# Patient Record
Sex: Male | Born: 1985 | Race: Black or African American | Hispanic: No | Marital: Single | Smoking: Current every day smoker
Health system: Southern US, Community
[De-identification: ages and names within clinical notes are randomized; demographics above are authoritative.]

## PROBLEM LIST (undated history)

## (undated) DIAGNOSIS — A749 Chlamydial infection, unspecified: Secondary | ICD-10-CM

## (undated) DIAGNOSIS — J40 Bronchitis, not specified as acute or chronic: Secondary | ICD-10-CM

---
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
## 2006-07-19 ENCOUNTER — Emergency Department (HOSPITAL_COMMUNITY): Admission: EM | Admit: 2006-07-19 | Discharge: 2006-07-19 | Payer: Self-pay | Admitting: Emergency Medicine

## 2006-11-25 ENCOUNTER — Emergency Department (HOSPITAL_COMMUNITY): Admission: EM | Admit: 2006-11-25 | Discharge: 2006-11-25 | Payer: Self-pay | Admitting: Emergency Medicine

## 2007-10-13 ENCOUNTER — Emergency Department (HOSPITAL_COMMUNITY): Admission: EM | Admit: 2007-10-13 | Discharge: 2007-10-13 | Payer: Self-pay | Admitting: Emergency Medicine

## 2008-04-11 ENCOUNTER — Emergency Department (HOSPITAL_COMMUNITY): Admission: EM | Admit: 2008-04-11 | Discharge: 2008-04-11 | Payer: Self-pay | Admitting: Emergency Medicine

## 2008-05-29 DIAGNOSIS — A749 Chlamydial infection, unspecified: Secondary | ICD-10-CM

## 2008-05-29 HISTORY — DX: Chlamydial infection, unspecified: A74.9

## 2008-08-24 ENCOUNTER — Emergency Department (HOSPITAL_COMMUNITY): Admission: EM | Admit: 2008-08-24 | Discharge: 2008-08-25 | Payer: Self-pay | Admitting: Emergency Medicine

## 2008-08-25 ENCOUNTER — Emergency Department (HOSPITAL_COMMUNITY): Admission: EM | Admit: 2008-08-25 | Discharge: 2008-08-25 | Payer: Self-pay | Admitting: Emergency Medicine

## 2008-09-08 ENCOUNTER — Emergency Department (HOSPITAL_COMMUNITY): Admission: EM | Admit: 2008-09-08 | Discharge: 2008-09-08 | Payer: Self-pay | Admitting: Emergency Medicine

## 2010-09-08 LAB — URINE CULTURE
Colony Count: NO GROWTH
Culture: NO GROWTH

## 2010-09-08 LAB — GC/CHLAMYDIA PROBE AMP, GENITAL
Chlamydia, DNA Probe: POSITIVE — AB
GC Probe Amp, Genital: NEGATIVE

## 2010-11-24 ENCOUNTER — Emergency Department (HOSPITAL_COMMUNITY)
Admission: EM | Admit: 2010-11-24 | Discharge: 2010-11-24 | Disposition: A | Discharge status: Home / Self Care | Payer: Self-pay | Attending: Emergency Medicine | Admitting: Emergency Medicine

## 2010-11-24 DIAGNOSIS — T675XXA Heat exhaustion, unspecified, initial encounter: Secondary | ICD-10-CM | POA: Insufficient documentation

## 2010-11-24 DIAGNOSIS — X30XXXA Exposure to excessive natural heat, initial encounter: Secondary | ICD-10-CM | POA: Insufficient documentation

## 2010-11-24 DIAGNOSIS — Y9269 Other specified industrial and construction area as the place of occurrence of the external cause: Secondary | ICD-10-CM | POA: Insufficient documentation

## 2011-02-22 LAB — CBC
MCHC: 34.1
MCV: 89.7
Platelets: 228
RBC: 4.97

## 2011-02-22 LAB — DIFFERENTIAL
Eosinophils Relative: 0
Lymphocytes Relative: 14
Lymphs Abs: 3.1
Neutro Abs: 18 — ABNORMAL HIGH

## 2011-02-22 LAB — RAPID URINE DRUG SCREEN, HOSP PERFORMED
Amphetamines: NOT DETECTED
Barbiturates: NOT DETECTED
Benzodiazepines: NOT DETECTED
Cocaine: NOT DETECTED
Opiates: NOT DETECTED

## 2011-02-22 LAB — COMPREHENSIVE METABOLIC PANEL
AST: 352 — ABNORMAL HIGH
CO2: 25
Calcium: 9.6
Creatinine, Ser: 1.16
GFR calc Af Amer: >60
GFR calc non Af Amer: >60

## 2011-02-28 LAB — GC/CHLAMYDIA PROBE AMP, GENITAL: GC Probe Amp, Genital: NEGATIVE

## 2011-03-15 LAB — GC/CHLAMYDIA PROBE AMP, GENITAL
Chlamydia, DNA Probe: POSITIVE — AB
GC Probe Amp, Genital: NEGATIVE

## 2011-05-23 ENCOUNTER — Emergency Department (HOSPITAL_COMMUNITY)
Admission: EM | Admit: 2011-05-23 | Discharge: 2011-05-23 | Disposition: A | Discharge status: Home / Self Care | Payer: Self-pay | Attending: Emergency Medicine | Admitting: Emergency Medicine

## 2011-05-23 ENCOUNTER — Encounter: Payer: Self-pay | Admitting: *Deleted

## 2011-05-23 DIAGNOSIS — R21 Rash and other nonspecific skin eruption: Secondary | ICD-10-CM | POA: Insufficient documentation

## 2011-05-23 DIAGNOSIS — L298 Other pruritus: Secondary | ICD-10-CM | POA: Insufficient documentation

## 2011-05-23 DIAGNOSIS — L2989 Other pruritus: Secondary | ICD-10-CM | POA: Insufficient documentation

## 2011-05-23 MED ORDER — DIPHENHYDRAMINE HCL 12.5 MG/5ML PO ELIX
25.0000 mg | ORAL_SOLUTION | Freq: Once | ORAL | Status: AC
  Administered 2011-05-23: 25 mg via ORAL
  Filled 2011-05-23: qty 10

## 2011-05-23 MED ORDER — FAMOTIDINE 40 MG/5ML PO SUSR
40.0000 mg | Freq: Once | ORAL | Status: AC
  Administered 2011-05-23: 40 mg via ORAL
  Filled 2011-05-23: qty 5

## 2011-05-23 MED ORDER — TRIAMCINOLONE ACETONIDE 0.1 % EX CREA: TOPICAL_CREAM | Freq: Two times a day (BID) | CUTANEOUS | Status: DC

## 2011-05-23 NOTE — ED Notes (Signed)
Pt reports red rash and itching to bilateral arms, hands and chest.

## 2011-05-23 NOTE — ED Provider Notes (Signed)
History     CSN: [id number]  Arrival date & time 05/23/11  1191   First MD Initiated Contact with Patient 05/23/11 [phone]      Chief Complaint  Patient presents with  . Rash    (Consider location/radiation/quality/duration/timing/severity/associated sxs/prior treatment) HPI Comments: Patient has c/o having a rash for the last 3 days.  It is pruritic in nature and is located bilaterally in both arms hands and his chest.  He states he's never had a reaction like this before and does not have any contacts with similar reaction.  He denies the use of any new products or medications.  Patient denies body aches, neck stiffness, fever, night sweats, chills, N/V, abd pn, SOB, lip swelling, or feeling like his throat is closing. Denies being bitten by anything.   Patient is a 25 y.o. male presenting with rash. The history is provided by the patient.  Rash  This is a new problem. The current episode started more than 2 days ago. The problem has not changed since onset.The problem is associated with an unknown factor. There has been no fever. The rash is present on the left hand, left arm, right arm, right hand and torso. The pain is at a severity of 0/10. The patient is experiencing no pain. Associated symptoms include itching. Pertinent negatives include no blisters, no pain and no weeping. He has tried antihistamines for the symptoms. The treatment provided moderate relief. Risk factors: no new medications, products or enviormental exposures.    History reviewed. No pertinent past medical history.  History reviewed. No pertinent past surgical history.  History reviewed. No pertinent family history.  History  Substance Use Topics  . Smoking status: Current Everyday Smoker -- 1.0 packs/day  . Smokeless tobacco: Not on file  . Alcohol Use: No      Review of Systems  Constitutional: Negative for fever, chills and appetite change.  HENT: Negative for congestion.   Eyes: Negative for visual  disturbance.  Respiratory: Negative for shortness of breath.   Cardiovascular: Negative for chest pain and leg swelling.  Gastrointestinal: Negative for abdominal pain.  Genitourinary: Negative for dysuria, urgency and frequency.  Skin: Positive for itching and rash.  Neurological: Negative for dizziness, syncope, weakness, light-headedness, numbness and headaches.  Psychiatric/Behavioral: Negative for confusion.  All other systems reviewed and are negative.    Allergies  Review of patient's allergies indicates no known allergies.  Home Medications   Current Outpatient Rx  Name Route Sig Dispense Refill  . TRIAMCINOLONE ACETONIDE 0.1 % EX CREA Topical Apply topically 2 (two) times daily. 30 g 0    BP 121/86  Pulse 86  Temp(Src) 97.4 F (36.3 C) (Oral)  Resp 19  SpO2 99%  Physical Exam  Nursing note and vitals reviewed. Constitutional: He is oriented to person, place, and time. He appears well-developed and well-nourished. No distress.  HENT:  Head: Normocephalic and atraumatic.  Eyes: Conjunctivae and EOM are normal.  Neck: Normal range of motion. Neck supple.  Pulmonary/Chest: Effort normal and breath sounds normal. No stridor. No respiratory distress. He has no wheezes. He has no rales. He exhibits no tenderness.       No stridor  Musculoskeletal: Normal range of motion.  Neurological: He is alert and oriented to person, place, and time.  Skin: Skin is warm and dry. Rash noted. No petechiae and no purpura noted. Rash is maculopapular (located on forearms and anterior chest. No induration or central clearing. ) and urticarial. Rash is not nodular,  not pustular and not vesicular. He is not diaphoretic.  Psychiatric: He has a normal mood and affect. His behavior is normal.    ED Course  Procedures (including critical care time)  Labs Reviewed - No data to display No results found.   1. Rash    Pt dc with Kenalog and Benadryl and referral to dermatology for follow  up.     MDM  Rash         Jaci Carrel, [place] 05/23/11 1135

## 2011-05-26 NOTE — ED Provider Notes (Signed)
Medical screening examination/treatment/procedure(s) were performed by non-physician practitioner and as supervising physician I was immediately available for consultation/collaboration.   Zandyr Barnhill R Luma Clopper, MD 05/26/11 0839 

## 2011-07-04 ENCOUNTER — Emergency Department (HOSPITAL_COMMUNITY)
Admission: EM | Admit: 2011-07-04 | Discharge: 2011-07-04 | Disposition: A | Discharge status: Home / Self Care | Payer: Self-pay | Attending: Emergency Medicine | Admitting: Emergency Medicine

## 2011-07-04 ENCOUNTER — Encounter (HOSPITAL_COMMUNITY): Payer: Self-pay | Admitting: *Deleted

## 2011-07-04 DIAGNOSIS — F17200 Nicotine dependence, unspecified, uncomplicated: Secondary | ICD-10-CM | POA: Insufficient documentation

## 2011-07-04 DIAGNOSIS — R300 Dysuria: Secondary | ICD-10-CM | POA: Insufficient documentation

## 2011-07-04 DIAGNOSIS — N342 Other urethritis: Secondary | ICD-10-CM | POA: Insufficient documentation

## 2011-07-04 DIAGNOSIS — R369 Urethral discharge, unspecified: Secondary | ICD-10-CM | POA: Insufficient documentation

## 2011-07-04 HISTORY — DX: Chlamydial infection, unspecified: A74.9

## 2011-07-04 LAB — URINALYSIS, ROUTINE W REFLEX MICROSCOPIC
Glucose, UA: NEGATIVE mg/dL
Nitrite: NEGATIVE
Specific Gravity, Urine: 1.036 — ABNORMAL HIGH (ref 1.005–1.030)
pH: 6.5 (ref 5.0–8.0)

## 2011-07-04 LAB — URINE MICROSCOPIC-ADD ON

## 2011-07-04 MED ORDER — CEFTRIAXONE SODIUM 250 MG IJ SOLR
250.0000 mg | Freq: Once | INTRAMUSCULAR | Status: AC
  Administered 2011-07-04: 250 mg via INTRAMUSCULAR
  Filled 2011-07-04: qty 250

## 2011-07-04 MED ORDER — ONDANSETRON 4 MG PO TBDP
ORAL_TABLET | ORAL | Status: AC
  Administered 2011-07-04: 4 mg
  Filled 2011-07-04: qty 1

## 2011-07-04 MED ORDER — AZITHROMYCIN 250 MG PO TABS
1000.0000 mg | ORAL_TABLET | Freq: Once | ORAL | Status: AC
  Administered 2011-07-04: 1000 mg via ORAL
  Filled 2011-07-04: qty 4

## 2011-07-04 MED ORDER — LIDOCAINE HCL (PF) 1 % IJ SOLN
INTRAMUSCULAR | Status: AC
  Administered 2011-07-04: 1 mL
  Filled 2011-07-04: qty 5

## 2011-07-04 NOTE — ED Provider Notes (Signed)
History    CSN: [id number]  Arrival date & time 07/04/11  0654   First MD Initiated Contact with Patient 07/04/11 [phone]      Chief Complaint  Patient presents with  . Penile Discharge   HPI: 26 yo man with PMH significant for a treated chlamydia infection in 2009 & 2010 presents with penile discharge (yellow, noticed with urination) x 1 day accompanied with burning on urination. He last had unprotected sex on Friday, and reports 2 sexual partners.  Has been tested for HIV in the past, negative. Denies any pruritis or scrotal involvement.  Denies pain with erection.  Denies pain with BM.  Past Medical History  Diagnosis Date  . Chlamydia 2010    treated   History reviewed. No pertinent past surgical history.  History reviewed. No pertinent family history.  History  Substance Use Topics  . Smoking status: Current Everyday Smoker -- 1.0 packs/day for 10 years    Types: Cigarettes  . Smokeless tobacco: Not on file  . Alcohol Use: 0.0 oz/week    3-4 drink(s) per week     occ     Review of Systems  Constitutional: Negative for fever, chills, diaphoresis, activity change, appetite change, fatigue and unexpected weight change.  HENT: Negative for congestion, sore throat, rhinorrhea, mouth sores and trouble swallowing.   Eyes: Negative for pain, discharge, redness, itching and visual disturbance.  Respiratory: Negative for cough, shortness of breath and wheezing.   Cardiovascular: Negative for chest pain and palpitations.  Gastrointestinal: Negative for vomiting, abdominal pain and abdominal distention.  Genitourinary: Positive for dysuria, discharge and penile pain. Negative for hematuria, flank pain, penile swelling, scrotal swelling, genital sores and testicular pain.  Musculoskeletal: Negative for myalgias, back pain, joint swelling, arthralgias and gait problem.  Neurological: Negative for dizziness, syncope, weakness, light-headedness, numbness and headaches.    Psychiatric/Behavioral: Negative for hallucinations, behavioral problems, confusion and agitation. The patient is not nervous/anxious.    Allergies  Review of patient's allergies indicates no known allergies.  Home Medications  No current outpatient prescriptions on file.  BP 139/83  Pulse 106  Temp(Src) 98.2 F (36.8 C) (Oral)  Resp 19  SpO2 97%  Physical Exam  Constitutional: He is oriented to person, place, and time. He appears well-developed and well-nourished.  Eyes: Conjunctivae and EOM are normal. Pupils are equal, round, and reactive to light.  Neck: Normal range of motion. Neck supple.  Cardiovascular: Regular rhythm, normal heart sounds and intact distal pulses.  Exam reveals no gallop and no friction rub.   No murmur heard. Pulmonary/Chest: Effort normal and breath sounds normal. No respiratory distress. He has no wheezes. He has no rales. He exhibits no tenderness.  Abdominal: Soft. Bowel sounds are normal. He exhibits no distension and no mass. There is no tenderness. There is no rebound and no guarding.  Genitourinary: No penile tenderness.       Unable to express discharge   Musculoskeletal: Normal range of motion.  Neurological: He is alert and oriented to person, place, and time.  Skin: Skin is warm and dry.  Psychiatric: He has a normal mood and affect. His behavior is normal.    ED Course  Procedures (including critical care time)  Labs Reviewed  URINALYSIS, ROUTINE W REFLEX MICROSCOPIC - Abnormal; Notable for the following:    APPearance CLOUDY (*)    Specific Gravity, Urine 1.036 (*)    Bilirubin Urine SMALL (*)    Ketones, ur 15 (*)    Leukocytes, UA  LARGE (*)    All other components within normal limits  URINE MICROSCOPIC-ADD ON - Abnormal; Notable for the following:    Bacteria, UA FEW (*)    All other components within normal limits  GC/CHLAMYDIA PROBE AMP, URINE   No results found.   No diagnosis found.  MDM  25 yo M with PMH of  chlamydia urethritis presents with signs and symptoms of urethritis, likely 2/2 chlamydia.  Will treat with azithromycin 1g PO x 1 dose and ceftriaxone 250mg  IM x 1.  Patient counseled to inform his sexual partners and to abstain from sexual activity and be sure to use condoms in the future.  He should also follow up outpatient for HIV testing.          Vernice Jefferson, MD 07/04/11 [phone]

## 2011-07-04 NOTE — ED Notes (Signed)
Pt is here with penile discharge that is cream in color since yesterday and has pain with urination

## 2011-07-04 NOTE — ED Provider Notes (Signed)
I saw and evaluated the patient, reviewed the resident's note and I agree with the findings and plan.   Forbes Cellar, MD 07/04/11 [phone]

## 2011-07-04 NOTE — ED Notes (Signed)
Pt medicated. Denies pain at the present. Vital signs stable. No signs of distress. Pt to be discharged home.

## 2011-07-04 NOTE — ED Notes (Signed)
Pt presents to department for evaluation of penile discharge. Onset yesterday. Pt states yellow colored penile discharge with painful urination. States unprotected sex with x2 partners recently. Denies hematuria. 5/10 pain at the time. Denies abdominal pain. He is alert and oriented x4. No signs of distress noted.

## 2011-08-29 ENCOUNTER — Encounter (HOSPITAL_COMMUNITY): Payer: Self-pay

## 2011-08-29 ENCOUNTER — Emergency Department (HOSPITAL_COMMUNITY)
Admission: EM | Admit: 2011-08-29 | Discharge: 2011-08-30 | Disposition: A | Discharge status: Home / Self Care | Payer: Self-pay | Attending: Emergency Medicine | Admitting: Emergency Medicine

## 2011-08-29 DIAGNOSIS — R369 Urethral discharge, unspecified: Secondary | ICD-10-CM | POA: Insufficient documentation

## 2011-08-29 DIAGNOSIS — R599 Enlarged lymph nodes, unspecified: Secondary | ICD-10-CM | POA: Insufficient documentation

## 2011-08-29 MED ORDER — AZITHROMYCIN 250 MG PO TABS
1000.0000 mg | ORAL_TABLET | Freq: Once | ORAL | Status: AC
  Administered 2011-08-30: 1000 mg via ORAL
  Filled 2011-08-29: qty 4

## 2011-08-29 MED ORDER — CEFTRIAXONE SODIUM 250 MG IJ SOLR
250.0000 mg | Freq: Once | INTRAMUSCULAR | Status: AC
  Administered 2011-08-30: 250 mg via INTRAMUSCULAR
  Filled 2011-08-29: qty 250

## 2011-08-29 NOTE — ED Notes (Signed)
Clear penile discharge x 2 days.  Burning w/urination.

## 2011-08-30 MED ORDER — LIDOCAINE HCL 2 % IJ SOLN
INTRAMUSCULAR | Status: AC
  Administered 2011-08-30: 20 mg
  Filled 2011-08-30: qty 1

## 2011-08-30 NOTE — ED Provider Notes (Signed)
History     CSN: [id number]  Arrival date & time 08/29/11  2151   First MD Initiated Contact with Patient 08/29/11 2218      Chief Complaint  Patient presents with  . Penile Discharge    (Consider location/radiation/quality/duration/timing/severity/associated sxs/prior treatment) HPI Comments: Patient reports that he has noticed clear colored penile discharge for the past 2 days.  He is currently sexually active with one partner.  PMH significant for Gonorrhea.  He was seen in the ED two months ago for same complaint.  He was treated for [facility] and chlamydia at that time with Ceftriaxone and Azithromycin, but no GC/Chlamydia testing was done.  Patient is a 26 y.o. male presenting with penile discharge. The history is provided by the patient and medical records.  Penile Discharge This is a new problem. Episode onset: two days ago. Pertinent negatives include no chills, fever, nausea, rash or vomiting. The symptoms are aggravated by nothing. He has tried nothing for the symptoms.    Past Medical History  Diagnosis Date  . Chlamydia 2010    treated    History reviewed. No pertinent past surgical history.  History reviewed. No pertinent family history.  History  Substance Use Topics  . Smoking status: Current Some Day Smoker -- 1.0 packs/day for 10 years    Types: Cigarettes  . Smokeless tobacco: Not on file  . Alcohol Use: 0.0 oz/week    3-4 drink(s) per week     daily      Review of Systems  Constitutional: Negative for fever and chills.  Gastrointestinal: Negative for nausea and vomiting.  Genitourinary: Positive for discharge. Negative for hematuria, penile swelling, scrotal swelling, difficulty urinating, penile pain and testicular pain.  Skin: Negative for rash.    Allergies  Review of patient's allergies indicates no known allergies.  Home Medications  No current outpatient prescriptions on file.  BP 120/83  Pulse 77  Temp(Src) 98.2 F (36.8 C) (Oral)  Resp  16  Ht 5\' 5"  (1.651 m)  Wt 130 lb (58.968 kg)  BMI 21.63 kg/m2  SpO2 100%  Physical Exam  Nursing note and vitals reviewed. Constitutional: He appears well-developed and well-nourished. No distress.  HENT:  Head: Normocephalic and atraumatic.  Cardiovascular: Normal rate, regular rhythm and normal heart sounds.   Pulmonary/Chest: Effort normal and breath sounds normal.  Abdominal: Soft. There is no tenderness.  Genitourinary: Testes normal. Right testis shows no mass, no swelling and no tenderness. Left testis shows no mass, no swelling and no tenderness. Circumcised. No penile erythema. Discharge found.       Small amount of greenish/yellowish penile discharge.  Lymphadenopathy:       Right: Inguinal adenopathy present.       Left: No inguinal adenopathy present.  Neurological: He is alert.  Skin: Skin is warm and dry. He is not diaphoretic. No erythema.    ED Course  Procedures (including critical care time)   Labs Reviewed  GC/CHLAMYDIA PROBE AMP, GENITAL   No results found.   1. Penile discharge       MDM  Due to patient's past history of gonorrhea and inguinal adenopathy and yellow/green discharge visualized on exam, patient was treated with Ceftriaxone and Azithromycin while in the ED.  GC/Chlamydia pending.        Pascal Lux [place], PA-C 08/30/11 0136  Magnus Sinning, PA-C 08/30/11 [phone]

## 2011-08-30 NOTE — ED Provider Notes (Signed)
Medical screening examination/treatment/procedure(s) were performed by non-physician practitioner and as supervising physician I was immediately available for consultation/collaboration.  Wyonia Fontanella M Sylwia Cuervo, MD 08/30/11 0657 

## 2011-11-27 ENCOUNTER — Encounter (HOSPITAL_COMMUNITY): Payer: Self-pay | Admitting: Emergency Medicine

## 2011-11-27 ENCOUNTER — Emergency Department (HOSPITAL_COMMUNITY)
Admission: EM | Admit: 2011-11-27 | Discharge: 2011-11-27 | Disposition: A | Discharge status: Home / Self Care | Payer: Self-pay | Attending: Emergency Medicine | Admitting: Emergency Medicine

## 2011-11-27 DIAGNOSIS — F17200 Nicotine dependence, unspecified, uncomplicated: Secondary | ICD-10-CM | POA: Insufficient documentation

## 2011-11-27 DIAGNOSIS — J029 Acute pharyngitis, unspecified: Secondary | ICD-10-CM | POA: Insufficient documentation

## 2011-11-27 MED ORDER — DEXAMETHASONE SODIUM PHOSPHATE 10 MG/ML IJ SOLN
10.0000 mg | Freq: Once | INTRAMUSCULAR | Status: AC
  Administered 2011-11-27: 10 mg via INTRAMUSCULAR
  Filled 2011-11-27: qty 1

## 2011-11-27 MED ORDER — IBUPROFEN 800 MG PO TABS: 800.0000 mg | ORAL_TABLET | Freq: Three times a day (TID) | ORAL | Status: AC

## 2011-11-27 NOTE — ED Provider Notes (Signed)
History     CSN: [id number]  Arrival date & time 11/27/11  7829   First MD Initiated Contact with Patient 11/27/11 [phone]      Chief Complaint  Patient presents with  . Sore Throat    (Consider location/radiation/quality/duration/timing/severity/associated sxs/prior treatment) HPI Comments: Sore throat R>L onset yesterday with painful swallowing.  No fever, chills, nausea, vomiting, cough, abdominal pain, rash. No treatment at home.   Patient is a 26 y.o. male presenting with pharyngitis. The history is provided by the patient.  Sore Throat This is a new problem. The current episode started yesterday. The problem occurs constantly. The problem has not changed since onset.Pertinent negatives include no chest pain, no abdominal pain, no headaches and no shortness of breath. The symptoms are aggravated by swallowing. Nothing relieves the symptoms. He has tried nothing for the symptoms.    Past Medical History  Diagnosis Date  . Chlamydia 2010    treated    History reviewed. No pertinent past surgical history.  History reviewed. No pertinent family history.  History  Substance Use Topics  . Smoking status: Current Some Day Smoker -- 1.0 packs/day for 10 years    Types: Cigarettes  . Smokeless tobacco: Not on file  . Alcohol Use: 0.0 oz/week    3-4 drink(s) per week     daily      Review of Systems  Constitutional: Negative for fever, activity change and appetite change.  HENT: Positive for sore throat and trouble swallowing. Negative for congestion, rhinorrhea and neck pain.   Respiratory: Negative for cough, chest tightness and shortness of breath.   Cardiovascular: Negative for chest pain.  Gastrointestinal: Negative for vomiting and abdominal pain.  Genitourinary: Negative for dysuria.  Musculoskeletal: Negative for back pain.  Skin: Negative for rash.  Neurological: Negative for dizziness, weakness and headaches.    Allergies  Review of patient's allergies  indicates no known allergies.  Home Medications   Current Outpatient Rx  Name Route Sig Dispense Refill  . IBUPROFEN 800 MG PO TABS Oral Take 1 tablet (800 mg total) by mouth 3 (three) times daily. 21 tablet 0    BP 130/88  Pulse 73  Temp 98.3 F (36.8 C) (Oral)  Resp 16  SpO2 100%  Physical Exam  Constitutional: He is oriented to person, place, and time. He appears well-developed and well-nourished. No distress.  HENT:  Head: Normocephalic and atraumatic.  Mouth/Throat: Oropharynx is clear and moist. No oropharyngeal exudate.       No asymmetry, no tongue elevation, no exudates  Eyes: Conjunctivae and EOM are normal. Pupils are equal, round, and reactive to light.  Neck: Normal range of motion. Neck supple.       No meningismus  Cardiovascular: Normal rate, regular rhythm and normal heart sounds.   No murmur heard. Pulmonary/Chest: Effort normal and breath sounds normal. No respiratory distress.  Abdominal: Soft. There is no tenderness. There is no rebound and no guarding.  Musculoskeletal: Normal range of motion. He exhibits no edema and no tenderness.  Lymphadenopathy:    He has cervical adenopathy.  Neurological: He is alert and oriented to person, place, and time. No cranial nerve deficit.  Skin: Skin is warm.    ED Course  Procedures (including critical care time)   Labs Reviewed  RAPID STREP SCREEN   No results found.   1. Pharyngitis       MDM  Sore throat, painful swallowing, lymphadenopathy.  Controlling secretions, tolerating PO. Nontoxic appearing.  Rapid strep  negative. Patient with one Center criteria. Suspect viral pharyngitis we'll treat with supportive care. Recheck precautions.      Glynn Octave, MD 11/27/11 [phone]

## 2011-11-27 NOTE — Discharge Instructions (Signed)

## 2011-11-27 NOTE — ED Notes (Signed)
Pt reports sore throat onset yesterday. Pt reports painful to swallow. Pt denies fever.

## 2012-01-14 ENCOUNTER — Emergency Department (HOSPITAL_COMMUNITY)
Admission: EM | Admit: 2012-01-14 | Discharge: 2012-01-14 | Disposition: A | Discharge status: Home / Self Care | Payer: Self-pay | Attending: Emergency Medicine | Admitting: Emergency Medicine

## 2012-01-14 ENCOUNTER — Encounter (HOSPITAL_COMMUNITY): Payer: Self-pay | Admitting: *Deleted

## 2012-01-14 DIAGNOSIS — F17200 Nicotine dependence, unspecified, uncomplicated: Secondary | ICD-10-CM | POA: Insufficient documentation

## 2012-01-14 DIAGNOSIS — K089 Disorder of teeth and supporting structures, unspecified: Secondary | ICD-10-CM | POA: Insufficient documentation

## 2012-01-14 DIAGNOSIS — R221 Localized swelling, mass and lump, neck: Secondary | ICD-10-CM | POA: Insufficient documentation

## 2012-01-14 DIAGNOSIS — K0889 Other specified disorders of teeth and supporting structures: Secondary | ICD-10-CM

## 2012-01-14 DIAGNOSIS — R220 Localized swelling, mass and lump, head: Secondary | ICD-10-CM | POA: Insufficient documentation

## 2012-01-14 MED ORDER — PENICILLIN V POTASSIUM 500 MG PO TABS: 500.0000 mg | ORAL_TABLET | Freq: Four times a day (QID) | ORAL | Status: AC

## 2012-01-14 MED ORDER — TRAMADOL HCL 50 MG PO TABS: 50.0000 mg | ORAL_TABLET | Freq: Four times a day (QID) | ORAL | Status: AC | PRN

## 2012-01-14 NOTE — ED Provider Notes (Signed)
History  This chart was scribed for Benny Lennert, MD by Erskine Emery. This patient was seen in room TR09C/TR09C and the patient's care was started at 11:37.   CSN: [id number]  Arrival date & time 01/14/12  1116   None     Chief Complaint  Patient presents with  . Dental Pain    (Consider location/radiation/quality/duration/timing/severity/associated sxs/prior Treatment) Donald Carr is a 26 y.o. male who presents to the Emergency Department complaining of dental pain, worsening for the last two days, but intermittent for the last month. Pt denies taking any medication for the pain or any associated emesis but reports spitting a lot. Pt has NKDA.  Patient is a 26 y.o. male presenting with tooth pain. The history is provided by the patient. No language interpreter was used.  Dental PainPrimary symptoms do not include fever or shortness of breath. The symptoms began more than 1 month ago. The symptoms are waxing and waning. The symptoms are new. The symptoms occur intermittently.  Medical issues include: smoking.      Past Medical History  Diagnosis Date  . Chlamydia 2010    treated    History reviewed. No pertinent past surgical history.  History reviewed. No pertinent family history.  History  Substance Use Topics  . Smoking status: Current Some Day Smoker -- 1.0 packs/day for 10 years    Types: Cigarettes  . Smokeless tobacco: Not on file  . Alcohol Use: 0.0 oz/week    3-4 drink(s) per week     daily      Review of Systems  Constitutional: Negative for fever and chills.  HENT: Positive for dental problem.   Respiratory: Negative for shortness of breath.   Gastrointestinal: Negative for nausea and vomiting.  Skin: Negative for rash.  Neurological: Negative for weakness.    Allergies  Review of patient's allergies indicates no known allergies.  Home Medications  No current outpatient prescriptions on file.  BP 138/88  Pulse 62  Temp 97.9 F (36.6 C)  (Oral)  Resp 16  SpO2 100%  Physical Exam  Constitutional: He is oriented to person, place, and time. He appears well-developed and well-nourished. No distress.  HENT:  Head: Normocephalic and atraumatic.       Some swelling around the left lower molar with some tenderness.  Eyes: Conjunctivae are normal.  Neck: No tracheal deviation present.  Cardiovascular:  No murmur heard. Musculoskeletal: Normal range of motion.  Neurological: He is oriented to person, place, and time.  Skin: Skin is warm.  Psychiatric: He has a normal mood and affect.    ED Course  Procedures (including critical care time) DIAGNOSTIC STUDIES: Oxygen Saturation is 100% on room air, normal by my interpretation.    COORDINATION OF CARE: 11:35--I evaluated the patient and we discussed a treatment plan including antibiotics and pain medication to which the pt agreed. I instructed the pt to follow up with a dentist.   Labs Reviewed - No data to display No results found.   No diagnosis found.    MDM        The chart was scribed for me under my direct supervision.  I personally performed the history, physical, and medical decision making and all procedures in the evaluation of this patient.Benny Lennert, MD 01/14/12 1239

## 2012-01-14 NOTE — ED Notes (Signed)
Pt reports left lower dental pain since last night

## 2012-05-16 ENCOUNTER — Emergency Department (INDEPENDENT_AMBULATORY_CARE_PROVIDER_SITE_OTHER): Payer: BC Managed Care – PPO

## 2012-05-16 ENCOUNTER — Emergency Department (INDEPENDENT_AMBULATORY_CARE_PROVIDER_SITE_OTHER)
Admission: EM | Admit: 2012-05-16 | Discharge: 2012-05-16 | Disposition: A | Discharge status: Home / Self Care | Payer: BC Managed Care – PPO | Source: Home / Self Care | Attending: Emergency Medicine | Admitting: Emergency Medicine

## 2012-05-16 ENCOUNTER — Encounter (HOSPITAL_COMMUNITY): Payer: Self-pay | Admitting: *Deleted

## 2012-05-16 DIAGNOSIS — J208 Acute bronchitis due to other specified organisms: Secondary | ICD-10-CM

## 2012-05-16 DIAGNOSIS — J209 Acute bronchitis, unspecified: Secondary | ICD-10-CM

## 2012-05-16 DIAGNOSIS — R0789 Other chest pain: Secondary | ICD-10-CM

## 2012-05-16 DIAGNOSIS — R071 Chest pain on breathing: Secondary | ICD-10-CM

## 2012-05-16 MED ORDER — GUAIFENESIN-CODEINE 100-10 MG/5ML PO SYRP: 10.0000 mL | ORAL_SOLUTION | Freq: Four times a day (QID) | ORAL | Status: DC | PRN

## 2012-05-16 MED ORDER — ALBUTEROL SULFATE HFA 108 (90 BASE) MCG/ACT IN AERS: 1.0000 | INHALATION_SPRAY | Freq: Four times a day (QID) | RESPIRATORY_TRACT | Status: DC | PRN

## 2012-05-16 NOTE — ED Notes (Signed)
Pt  Reports  r  Sided  Chest  Pain  -  Hurts  When  He  Takes  A  Deep  Breath  -  Hurts  When he  Coughs  Or  Raises  His  r  Arm  -  Lungs  Are  Clear  The  Cough is  For  The  Most part  Non  Productive

## 2012-05-16 NOTE — ED Provider Notes (Signed)
Chief Complaint  Patient presents with  . Chest Pain    History of Present Illness:   Donald Carr is a 26 year old male who has had a two-day history of cough productive of clear sputum, chest tightness, and wheezing. He is a cigarette smoker. No history of asthma. He's had pain in his right submammary area whenever he coughs. He's also had some posttussive vomiting, chills, fever, nasal congestion with yellow rhinorrhea, headache, and sore throat. He has not been exposed to anything in particular. He tried DayQuil without relief.  Review of Systems:  Other than noted above, the patient denies any of the following symptoms. Systemic:  No fever, chills, sweats, fatigue, myalgias, headache, or anorexia. Eye:  No redness, pain or drainage. ENT:  No earache, ear congestion, nasal congestion, sneezing, rhinorrhea, sinus pressure, sinus pain, post nasal drip, or sore throat. Lungs:  No cough, sputum production, wheezing, shortness of breath, or chest pain. GI:  No abdominal pain, nausea, vomiting, or diarrhea.  PMFSH:  Past medical history, family history, social history, meds, and allergies were reviewed.  Physical Exam:   Vital signs:  BP 153/78  Pulse 82  Temp 99.8 F (37.7 C) (Oral)  Resp 18  SpO2 95% General:  Alert, in no distress. Eye:  No conjunctival injection or drainage. Lids were normal. ENT:  TMs and canals were normal, without erythema or inflammation.  Nasal mucosa was clear and uncongested, without drainage.  Mucous membranes were moist.  Pharynx was clear, without exudate or drainage.  There were no oral ulcerations or lesions. Neck:  Supple, no adenopathy, tenderness or mass. Lungs:  No respiratory distress.  Lungs were clear to auscultation, without wheezes, rales or rhonchi.  Breath sounds were clear and equal bilaterally.  Heart:  Regular rhythm, without gallops, murmers or rubs. Skin:  Clear, warm, and dry, without rash or lesions.  Labs:   Results for orders placed during  the hospital encounter of 11/27/11  RAPID STREP SCREEN      Component Value Range   Streptococcus, Group A Screen (Direct) NEGATIVE  NEGATIVE    Radiology:  Dg Chest 2 View  05/16/2012  *RADIOLOGY REPORT*  Clinical Data: Productive cough for 2 days  CHEST - 2 VIEW  Comparison: None.  Findings: The heart, mediastinal, and hilar contours are within normal limits.  Pulmonary vascularity is normal.  Trachea is midline.  The lungs are clear.  There is no pleural effusion or pneumothorax.  No acute bony abnormality identified.  Visualized upper abdomen is unremarkable.  IMPRESSION: No acute cardiopulmonary disease.   Original Report Authenticated By: Britta Mccreedy, M.D.    I reviewed the images independently and personally and concur with the radiologist's findings.  Assessment:  The primary encounter diagnosis was Viral bronchitis. A diagnosis of Chest wall pain was also pertinent to this visit.  Plan:   1.  The following meds were prescribed:   New Prescriptions   ALBUTEROL (PROVENTIL HFA;VENTOLIN HFA) 108 (90 BASE) MCG/ACT INHALER    Inhale 1-2 puffs into the lungs every 6 (six) hours as needed for wheezing.   GUAIFENESIN-CODEINE (GUIATUSS AC) 100-10 MG/5ML SYRUP    Take 10 mLs by mouth 4 (four) times daily as needed for cough.   2.  The patient was instructed in symptomatic care and handouts were given. 3.  The patient was told to return if becoming worse in any way, if no better in 3 or 4 days, and given some red flag symptoms that would indicate earlier return.  Reuben Likes, MD 05/16/12 2029

## 2012-09-29 ENCOUNTER — Emergency Department (HOSPITAL_COMMUNITY)
Admission: EM | Admit: 2012-09-29 | Discharge: 2012-09-30 | Disposition: A | Discharge status: Home / Self Care | Payer: Self-pay | Attending: Emergency Medicine | Admitting: Emergency Medicine

## 2012-09-29 ENCOUNTER — Encounter (HOSPITAL_COMMUNITY): Payer: Self-pay

## 2012-09-29 DIAGNOSIS — F17200 Nicotine dependence, unspecified, uncomplicated: Secondary | ICD-10-CM | POA: Insufficient documentation

## 2012-09-29 DIAGNOSIS — Z79899 Other long term (current) drug therapy: Secondary | ICD-10-CM | POA: Insufficient documentation

## 2012-09-29 DIAGNOSIS — M79609 Pain in unspecified limb: Secondary | ICD-10-CM | POA: Insufficient documentation

## 2012-09-29 DIAGNOSIS — M79671 Pain in right foot: Secondary | ICD-10-CM

## 2012-09-29 DIAGNOSIS — Z8619 Personal history of other infectious and parasitic diseases: Secondary | ICD-10-CM | POA: Insufficient documentation

## 2012-09-29 DIAGNOSIS — Z8709 Personal history of other diseases of the respiratory system: Secondary | ICD-10-CM | POA: Insufficient documentation

## 2012-09-29 HISTORY — DX: Bronchitis, not specified as acute or chronic: J40

## 2012-09-29 NOTE — ED Notes (Signed)
Patient presents with c/o right foot pain between the 4th and 5th toes. States that "there's a hole there." First noticed it about 3 weeks ago, thought it that would go away. Denies any injury or trauma. Endorses odor. Denies any drainage. No fevers, sweats or chills.

## 2012-09-29 NOTE — ED Notes (Addendum)
Pt c/o right foot pain, in between 4th and 5th digit. Pt reports it started 3 weeks ago, he noticed a blister and now there is a hole in between his toes. Pt sts this is his first time being evaluated for this issue. Pt denies taking any pain medications PTA. Pt denies being diabetic, denies injury to site. Pt ambulatory. Pt in nad, skin warm and dry, resp e/u.

## 2012-09-30 NOTE — ED Notes (Signed)
Pt discharged.Vital signs stable and GCS 15 

## 2012-09-30 NOTE — ED Provider Notes (Signed)
History     CSN: [id number]  Arrival date & time 09/29/12  2201   First MD Initiated Contact with Patient 09/29/12 2251      Chief Complaint  Patient presents with  . Foot Pain    (Consider location/radiation/quality/duration/timing/severity/associated sxs/prior treatment) HPI Comments: TREON KEHL III is a 27 y/o M presenting to the ED with right foot pain x 2-3 weeks, has gotten worse over the past week. Patient reported that he noticed a "hole" between his right pinky and ring toe - stated that there is a constant aching sensation there, without radiation. Reported that pain is worse when walking and having foot in a sneaker. Denied trauma, injury, drainage, bleeding, fever, chills, sweats, swelling, inflammation, hiking.   The history is provided by the patient. No language interpreter was used.    Past Medical History  Diagnosis Date  . Chlamydia 2010    treated  . Bronchitis     History reviewed. No pertinent past surgical history.  No family history on file.  History  Substance Use Topics  . Smoking status: Current Every Day Smoker -- 1.00 packs/day for 10 years    Types: Cigarettes  . Smokeless tobacco: Never Used  . Alcohol Use: 0.0 oz/week    3-4 drink(s) per week     Comment: daily      Review of Systems  Constitutional: Negative for fever, chills and fatigue.  HENT: Negative for congestion, sore throat, trouble swallowing and neck pain.   Eyes: Negative for photophobia, pain and visual disturbance.  Respiratory: Negative for cough, chest tightness and shortness of breath.   Cardiovascular: Negative for chest pain.  Gastrointestinal: Negative for nausea, vomiting, abdominal pain, diarrhea and constipation.  Genitourinary: Negative for difficulty urinating.  Musculoskeletal: Positive for arthralgias. Negative for joint swelling.       Right pinky toe discomfort  Skin: Negative for rash and wound.  Neurological: Negative for dizziness, weakness,  light-headedness, numbness and headaches.  Psychiatric/Behavioral: Negative for confusion.  All other systems reviewed and are negative.    Allergies  Review of patient's allergies indicates no known allergies.  Home Medications   Current Outpatient Rx  Name  Route  Sig  Dispense  Refill  . albuterol (PROVENTIL HFA;VENTOLIN HFA) 108 (90 BASE) MCG/ACT inhaler   Inhalation   Inhale 1-2 puffs into the lungs every 6 (six) hours as needed for wheezing.   1 Inhaler   0     BP 115/79  Pulse 87  Temp(Src) 98.2 F (36.8 C) (Oral)  Resp 16  Ht 5\' 5"  (1.651 m)  Wt 125 lb (56.7 kg)  BMI 20.8 kg/m2  SpO2 99%  Physical Exam  Nursing note and vitals reviewed. Constitutional: He is oriented to person, place, and time. He appears well-developed and well-nourished. No distress.  HENT:  Head: Normocephalic and atraumatic.  Mouth/Throat: Oropharynx is clear and moist. No oropharyngeal exudate.  Eyes: Conjunctivae and EOM are normal. Pupils are equal, round, and reactive to light. Right eye exhibits no discharge. Left eye exhibits no discharge.  Neck: Normal range of motion. Neck supple. No tracheal deviation present.  Negative nuchal rigidity Negative lymphadenopathy Negative neck stiffness  Cardiovascular: Normal rate, regular rhythm and normal heart sounds.  Exam reveals no friction rub.   No murmur heard. Radial pulses 2+ bilaterally Pedal pulses 2+ bilaterally Negative Homan's sign Negative calf pain Negative ankle and leg swelling bilaterally Negative pitting edema  Pulmonary/Chest: Effort normal and breath sounds normal. No respiratory distress. He  has no wheezes. He has no rales.  Musculoskeletal: Normal range of motion. He exhibits tenderness. He exhibits no edema.       Feet:  Mild tenderness upon palpation to left pinky toe Full ROM to lower extremities bilaterally  Lymphadenopathy:    He has no cervical adenopathy.  Neurological: He is alert and oriented to person,  place, and time. No cranial nerve deficit. He exhibits normal muscle tone. Coordination normal.  Cranial nerves III-XII grossly intact Sensation to lower extremities bilaterally intact  Skin: Skin is warm and dry. No rash noted. He is not diaphoretic. No erythema.  Psychiatric: He has a normal mood and affect. His behavior is normal. Thought content normal.    ED Course  Procedures (including critical care time)  Labs Reviewed - No data to display No results found.   1. Foot pain, right       MDM  I personally examined and evaluated the patient. Presenting with "hole" between the right pinky and ring toes. Scabbed over lesion noted between right pinky and ring toe - no sign of inflammation, swelling, erythema, drainage, bleeding. Mild discomfort upon palpation to right pinky toe. Dr. Rulon Abide saw patient - abrasion of the toe - placed bacitracin on it. Patient afebrile, normotensive, non-tachycardic, alert and oriented - aseptic, non-toxic appearing, no acute distress, no sign of infection. Patient discharged. Bacitracin placed on wound. Discussed care for site - cleaning process and antibiotic ointment to be placed. Discussed to follow-up with [facility]. Discussed smoking cessation and therapies. Resource guide given, finding a physician needed. Discussed to monitor symptoms and if symptoms change or worsen to report back to the ED. Patient agreed to plan of care, understood, all questions answered.    Raymon Mutton, PA-C 09/30/12 1349

## 2012-09-30 NOTE — ED Provider Notes (Signed)
Medical screening examination/treatment/procedure(s) were performed by non-physician practitioner and as supervising physician I was immediately available for consultation/collaboration.  John-Adam Koven Belinsky, M.D.     John-Adam Viviana Trimble, MD 09/30/12 2254 

## 2013-09-18 ENCOUNTER — Emergency Department (HOSPITAL_COMMUNITY)
Admission: EM | Admit: 2013-09-18 | Discharge: 2013-09-18 | Discharge status: Against Medical Advice | Payer: BC Managed Care – PPO

## 2013-09-18 NOTE — ED Notes (Signed)
Pt not in waiting room

## 2013-09-20 ENCOUNTER — Encounter (HOSPITAL_COMMUNITY): Payer: Self-pay | Admitting: Emergency Medicine

## 2013-09-20 ENCOUNTER — Emergency Department (HOSPITAL_COMMUNITY)
Admission: EM | Admit: 2013-09-20 | Discharge: 2013-09-20 | Disposition: A | Discharge status: Home / Self Care | Payer: BC Managed Care – PPO | Attending: Emergency Medicine | Admitting: Emergency Medicine

## 2013-09-20 DIAGNOSIS — F17200 Nicotine dependence, unspecified, uncomplicated: Secondary | ICD-10-CM | POA: Insufficient documentation

## 2013-09-20 DIAGNOSIS — R599 Enlarged lymph nodes, unspecified: Secondary | ICD-10-CM | POA: Insufficient documentation

## 2013-09-20 DIAGNOSIS — Z8709 Personal history of other diseases of the respiratory system: Secondary | ICD-10-CM | POA: Insufficient documentation

## 2013-09-20 DIAGNOSIS — K029 Dental caries, unspecified: Secondary | ICD-10-CM | POA: Insufficient documentation

## 2013-09-20 DIAGNOSIS — Z8619 Personal history of other infectious and parasitic diseases: Secondary | ICD-10-CM | POA: Insufficient documentation

## 2013-09-20 MED ORDER — PENICILLIN V POTASSIUM 500 MG PO TABS: 500.0000 mg | ORAL_TABLET | Freq: Three times a day (TID) | ORAL | Status: DC

## 2013-09-20 MED ORDER — OXYCODONE-ACETAMINOPHEN 5-325 MG PO TABS: 2.0000 | ORAL_TABLET | ORAL | Status: DC | PRN

## 2013-09-20 NOTE — ED Notes (Signed)
Pt. reports left lower molar pain for 2 days .

## 2013-09-20 NOTE — Discharge Instructions (Signed)

## 2013-09-20 NOTE — ED Provider Notes (Signed)
CSN: [id number]     Arrival date & time 09/20/13  2109 History   First MD Initiated Contact with Patient 09/20/13 2115 This chart was scribed for non-physician practitioner Domenic Moras, PA-C working with Saddie Benders. Dorna Mai, MD by Anastasia Pall, ED scribe. This patient was seen in room TR05C/TR05C and the patient's care was started at 9:25 PM.     Chief Complaint  Patient presents with  . Dental Pain   (Consider location/radiation/quality/duration/timing/severity/associated sxs/prior Treatment) The history is provided by the patient. No language interpreter was used.   HPI Comments: Donald Carr is a 28 y.o. male who presents to the Emergency Department complaining of constant, left, lower dental pain, onset 2 days ago, with associated swelling, onset today. He reports eating, chewing, and cold air exacerbates his pain. He denies having tried anything for his pain. He reports h/o pain over the same tooth. He denies having dentist. He denies fever, chills, ear pain, rhinorrhea, sore throat, and any other associated symptoms.  He denies allergies to medications. He reports h/o smoking.   PCP - Default, Provider, MD  Past Medical History  Diagnosis Date  . Chlamydia 2010    treated  . Bronchitis    History reviewed. No pertinent past surgical history. No family history on file. History  Substance Use Topics  . Smoking status: Current Every Day Smoker -- 1.00 packs/day for 10 years    Types: Cigarettes  . Smokeless tobacco: Never Used  . Alcohol Use: 0.0 oz/week    3-4 drink(s) per week     Comment: daily    Review of Systems  Constitutional: Negative for fever and chills.  HENT: Positive for dental problem (left, lower) and facial swelling (mild). Negative for ear pain, rhinorrhea and sore throat.    Allergies  Review of patient's allergies indicates no known allergies.  Home Medications   Prior to Admission medications   Medication Sig Start Date End Date Taking? Authorizing  Provider  albuterol (PROVENTIL HFA;VENTOLIN HFA) 108 (90 BASE) MCG/ACT inhaler Inhale 1-2 puffs into the lungs every 6 (six) hours as needed for wheezing. 05/16/12   Harden Mo, MD   BP 124/83  Pulse 85  Temp(Src) 97.8 F (36.6 C) (Oral)  Resp 16  Ht 5\' 5"  (1.651 m)  Wt 130 lb (58.968 kg)  BMI 21.63 kg/m2  SpO2 100%  Physical Exam  Nursing note and vitals reviewed. Constitutional: He is oriented to person, place, and time. He appears well-developed and well-nourished. No distress.  HENT:  Head: Normocephalic and atraumatic.  Right Ear: External ear normal.  Left Ear: External ear normal.  Mouth/Throat: Uvula is midline, oropharynx is clear and moist and mucous membranes are normal. No trismus in the jaw. Dental caries present. No oropharyngeal exudate.  Tooth #18 with dental decay, gingival erythema and mild edema noted. Tender to palpation. No trismus. Some facial involvement as well.  Eyes: EOM are normal.  Neck: Neck supple.  Cardiovascular: Normal rate.   Pulmonary/Chest: Effort normal. No respiratory distress.  Musculoskeletal: Normal range of motion.  Lymphadenopathy:    He has cervical adenopathy.  Neurological: He is alert and oriented to person, place, and time.  Skin: Skin is warm and dry.  Psychiatric: He has a normal mood and affect. His behavior is normal.   ED Course  Procedures (including critical care time)  DIAGNOSTIC STUDIES: Oxygen Saturation is 100% on room air, normal by my interpretation.    COORDINATION OF CARE: 9:30 PM-Discussed treatment plan which  includes pain medication and antibiotic with pt at bedside and pt agreed to plan.   Labs Review Labs Reviewed - No data to display  Imaging Review No results found.   EKG Interpretation None     Medications - No data to display MDM   Final diagnoses:  Pain due to dental caries    BP 124/83  Pulse 85  Temp(Src) 97.8 F (36.6 C) (Oral)  Resp 16  Ht 5\' 5"  (1.651 m)  Wt 130 lb  (58.968 kg)  BMI 21.63 kg/m2  SpO2 100%   I personally performed the services described in this documentation, which was scribed in my presence. The recorded information has been reviewed and is accurate.     Domenic Moras, PA-C 09/21/13 [phone]

## 2013-09-21 NOTE — ED Provider Notes (Signed)
Medical screening examination/treatment/procedure(s) were performed by non-physician practitioner and as supervising physician I was immediately available for consultation/collaboration.   EKG Interpretation None        Jakobie Henslee Y. Metha Kolasa, MD 09/21/13 1250 

## 2013-09-24 IMAGING — CR DG CHEST 2V
2 series · 2 of 2 positions shown · non-contrast
Comparison: None.

CLINICAL DATA: Productive cough for 2 days

CHEST - 2 VIEW

[view not recorded (1 of 2)]
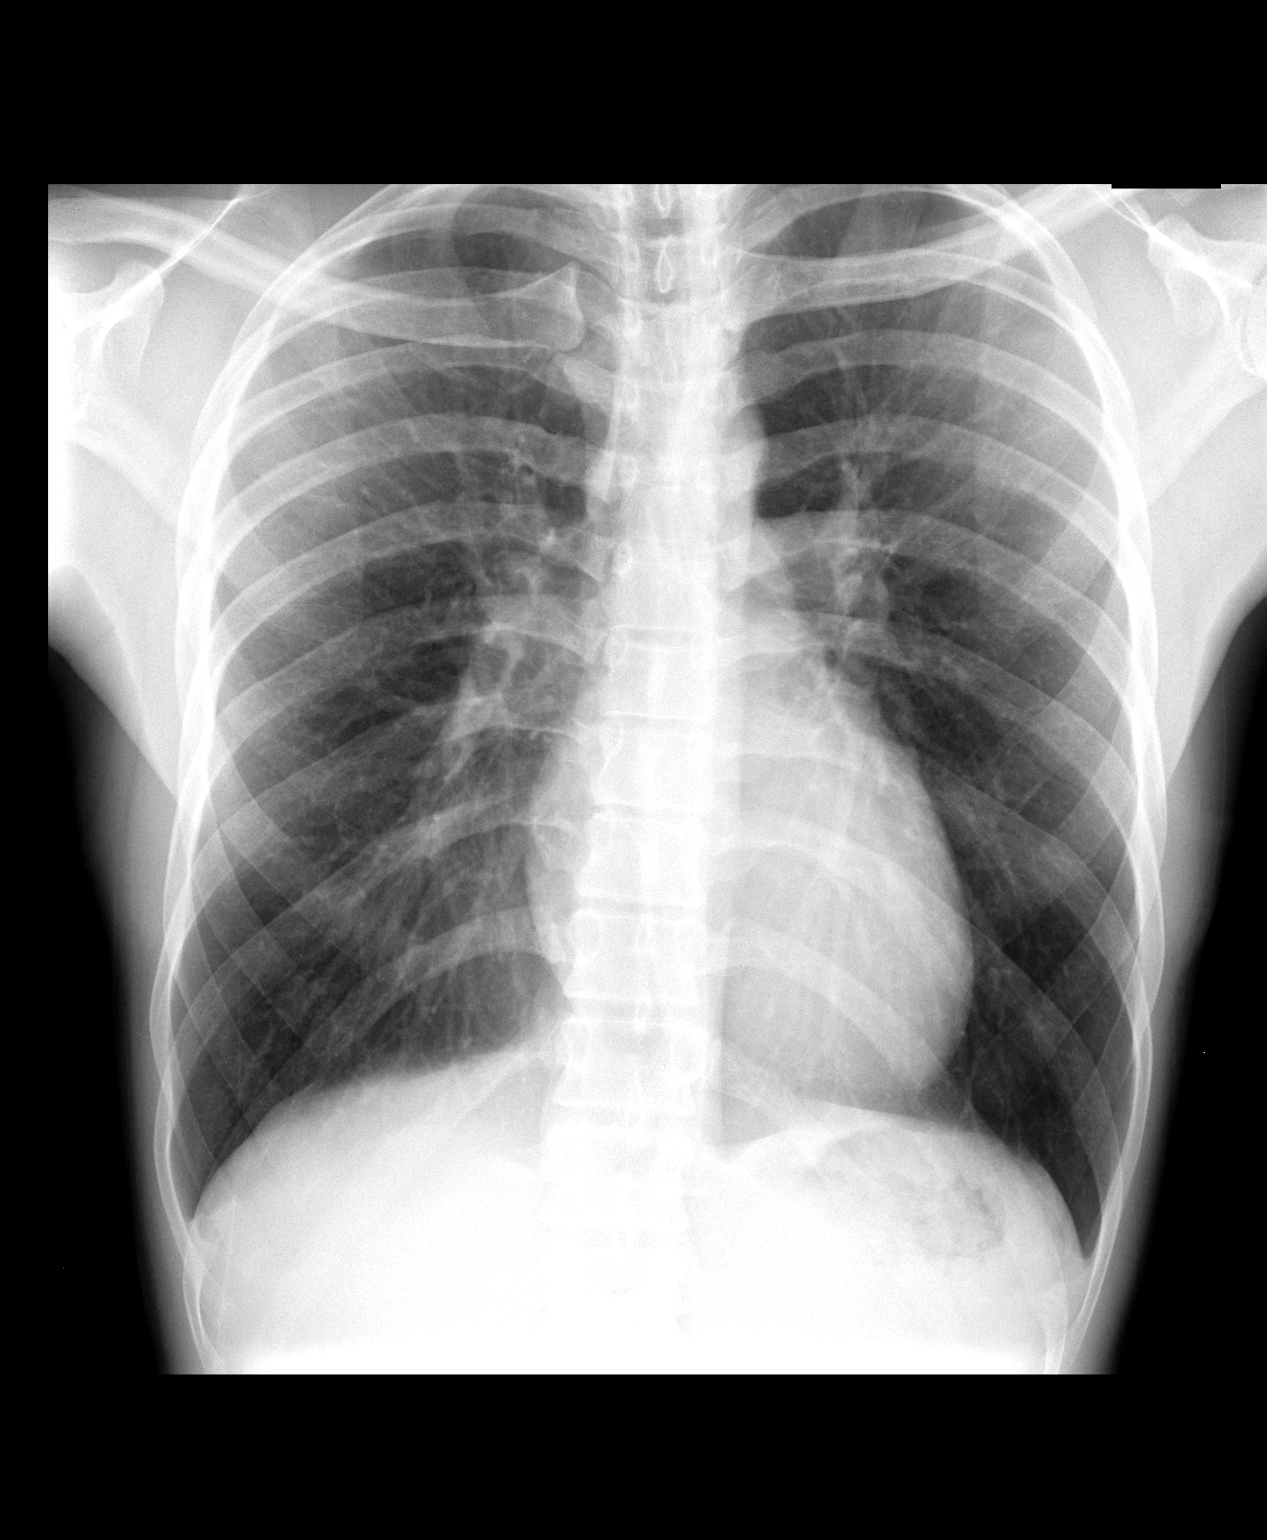

[view not recorded (2 of 2)]
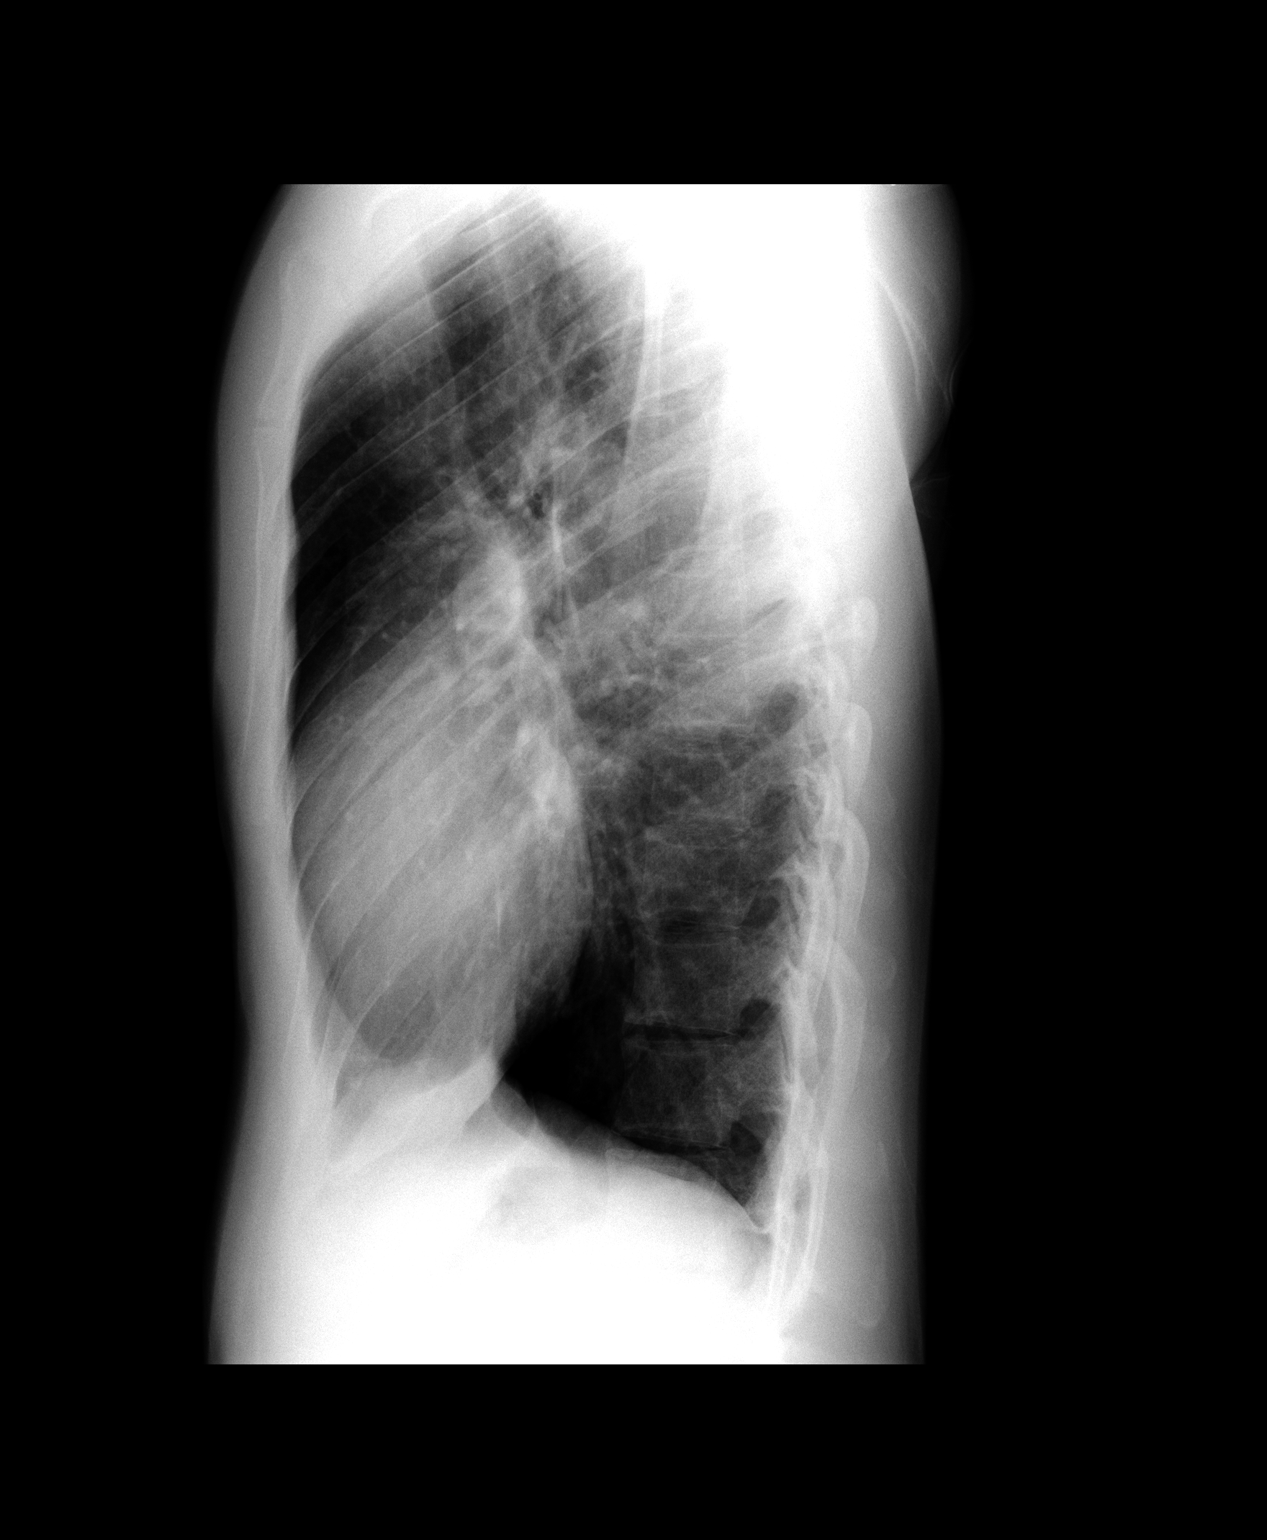

[2 of 2 positions shown; findings below may reference images not displayed]

FINDINGS: The heart, mediastinal, and hilar contours are within
normal limits.  Pulmonary vascularity is normal.  Trachea is
midline.  The lungs are clear.  There is no pleural effusion or
pneumothorax.  No acute bony abnormality identified.  Visualized
upper abdomen is unremarkable.
IMPRESSION: No acute cardiopulmonary disease.

## 2014-02-17 ENCOUNTER — Emergency Department (HOSPITAL_COMMUNITY)
Admission: EM | Admit: 2014-02-17 | Discharge: 2014-02-17 | Disposition: A | Discharge status: Home / Self Care | Payer: BC Managed Care – PPO | Attending: Emergency Medicine | Admitting: Emergency Medicine

## 2014-02-17 ENCOUNTER — Encounter (HOSPITAL_COMMUNITY): Payer: Self-pay | Admitting: Emergency Medicine

## 2014-02-17 DIAGNOSIS — K089 Disorder of teeth and supporting structures, unspecified: Secondary | ICD-10-CM | POA: Insufficient documentation

## 2014-02-17 DIAGNOSIS — Z8709 Personal history of other diseases of the respiratory system: Secondary | ICD-10-CM | POA: Insufficient documentation

## 2014-02-17 DIAGNOSIS — F17200 Nicotine dependence, unspecified, uncomplicated: Secondary | ICD-10-CM | POA: Insufficient documentation

## 2014-02-17 DIAGNOSIS — Z79899 Other long term (current) drug therapy: Secondary | ICD-10-CM | POA: Insufficient documentation

## 2014-02-17 DIAGNOSIS — K0889 Other specified disorders of teeth and supporting structures: Secondary | ICD-10-CM

## 2014-02-17 DIAGNOSIS — Z8619 Personal history of other infectious and parasitic diseases: Secondary | ICD-10-CM | POA: Insufficient documentation

## 2014-02-17 MED ORDER — HYDROCODONE-ACETAMINOPHEN 5-325 MG PO TABS: 1.0000 | ORAL_TABLET | ORAL | Status: DC | PRN

## 2014-02-17 MED ORDER — PENICILLIN V POTASSIUM 500 MG PO TABS: 500.0000 mg | ORAL_TABLET | Freq: Four times a day (QID) | ORAL | Status: DC

## 2014-02-17 NOTE — ED Provider Notes (Signed)
Medical screening examination/treatment/procedure(s) were performed by non-physician practitioner and as supervising physician I was immediately available for consultation/collaboration.   EKG Interpretation None       Orlie Dakin, MD 02/17/14 1554

## 2014-02-17 NOTE — ED Notes (Signed)
Patient states started having left lower teeth pain yesterday.  Patient states had been here before for the same thing.   Patient states he never followed up with dentist.

## 2014-02-17 NOTE — Discharge Instructions (Signed)
Take vicodin as needed for pain. Take Veetid as directed until gone. Follow up with Dr. Junita Push for further evaluation and management of your dental pain. Refer to attached documents for more information.

## 2014-02-17 NOTE — ED Provider Notes (Signed)
CSN: [id number]     Arrival date & time 02/17/14  0910 History  This chart was scribed for non-physician practitioner Alvina Chou, PA-C working with Orlie Dakin, MD by Ludger Nutting, ED Scribe. This patient was seen in room TR08C/TR08C and the patient's care was started at 9:40 AM.     Chief Complaint  Patient presents with  . Dental Pain    The history is provided by the patient. No language interpreter was used.    HPI Comments: Donald Carr is a 28 y.o. male who presents to the Emergency Department complaining of 1 day of gradual onset, constant, gradually worsened left lower dental pain with associated mild left sided facial and mild jaw swelling. Patient states he has a fractured tooth several months ago to the same area but never followed up with a dentist. He has not tried any OTC medications for his symptoms. He denies fever, chills.   Past Medical History  Diagnosis Date  . Chlamydia 2010    treated  . Bronchitis    History reviewed. No pertinent past surgical history. No family history on file. History  Substance Use Topics  . Smoking status: Current Every Day Smoker -- 1.00 packs/day for 10 years    Types: Cigarettes  . Smokeless tobacco: Never Used  . Alcohol Use: 0.0 oz/week    3-4 drink(s) per week     Comment: daily    Review of Systems  Constitutional: Negative for fever, chills and fatigue.  HENT: Positive for dental problem and facial swelling. Negative for trouble swallowing.   Eyes: Negative for visual disturbance.  Respiratory: Negative for shortness of breath.   Cardiovascular: Negative for chest pain and palpitations.  Gastrointestinal: Negative for nausea, vomiting, abdominal pain and diarrhea.  Genitourinary: Negative for dysuria and difficulty urinating.  Musculoskeletal: Negative for arthralgias and neck pain.  Skin: Negative for color change.  Neurological: Negative for dizziness and weakness.  Psychiatric/Behavioral: Negative for  dysphoric mood.      Allergies  Review of patient's allergies indicates no known allergies.  Home Medications   Prior to Admission medications   Medication Sig Start Date End Date Taking? Authorizing Provider  albuterol (PROVENTIL HFA;VENTOLIN HFA) 108 (90 BASE) MCG/ACT inhaler Inhale 1-2 puffs into the lungs every 6 (six) hours as needed for wheezing. 05/16/12   Harden Mo, MD  oxyCODONE-acetaminophen (PERCOCET/ROXICET) 5-325 MG per tablet Take 2 tablets by mouth every 4 (four) hours as needed for severe pain. 09/20/13   Domenic Moras, PA-C  penicillin v potassium (VEETID) 500 MG tablet Take 1 tablet (500 mg total) by mouth 3 (three) times daily. 09/20/13   Domenic Moras, PA-C   BP 117/84  Pulse 85  Temp(Src) 98.5 F (36.9 C) (Oral)  Resp 18  SpO2 100% Physical Exam  Nursing note and vitals reviewed. Constitutional: He is oriented to person, place, and time. He appears well-developed and well-nourished.  HENT:  Head: Normocephalic and atraumatic.  Poor dentition. Multiple decayed teeth. No tenderness to percussion. No abscess noted. Left lower facial tenderness to palpation with mild swelling.   Cardiovascular: Normal rate, regular rhythm and normal heart sounds.   Pulmonary/Chest: Effort normal and breath sounds normal. No respiratory distress. He has no wheezes. He has no rales.  Abdominal: He exhibits no distension.  Lymphadenopathy:    He has cervical adenopathy (left).  Neurological: He is alert and oriented to person, place, and time.  Skin: Skin is warm and dry.  Psychiatric: He has a normal  mood and affect.    ED Course  Procedures (including critical care time)  DIAGNOSTIC STUDIES: Oxygen Saturation is 100% on RA, normal by my interpretation.    COORDINATION OF CARE: 9:41 AM Discussed treatment plan with pt at bedside and pt agreed to plan.   Labs Review Labs Reviewed - No data to display  Imaging Review No results found.   EKG Interpretation None       MDM   Final diagnoses:  Pain, dental    9:51 AM Patient has no abscess or signs of ludwigs angina. Patient will have Veetid and vicodin for symptoms and instructions to follow up with Dr. Junita Push for further evaluation. Vitals stable and patient afebrile.    I personally performed the services described in this documentation, which was scribed in my presence. The recorded information has been reviewed and is accurate.   Alvina Chou, [place] 02/17/14 [phone]

## 2014-10-10 ENCOUNTER — Encounter (HOSPITAL_COMMUNITY): Payer: Self-pay | Admitting: Emergency Medicine

## 2014-10-10 ENCOUNTER — Emergency Department (HOSPITAL_COMMUNITY)
Admission: EM | Admit: 2014-10-10 | Discharge: 2014-10-10 | Disposition: A | Discharge status: Home / Self Care | Payer: Self-pay | Attending: Emergency Medicine | Admitting: Emergency Medicine

## 2014-10-10 DIAGNOSIS — Z79899 Other long term (current) drug therapy: Secondary | ICD-10-CM | POA: Insufficient documentation

## 2014-10-10 DIAGNOSIS — Z8709 Personal history of other diseases of the respiratory system: Secondary | ICD-10-CM | POA: Insufficient documentation

## 2014-10-10 DIAGNOSIS — T85848A Pain due to other internal prosthetic devices, implants and grafts, initial encounter: Secondary | ICD-10-CM

## 2014-10-10 DIAGNOSIS — Z8619 Personal history of other infectious and parasitic diseases: Secondary | ICD-10-CM | POA: Insufficient documentation

## 2014-10-10 DIAGNOSIS — K088 Other specified disorders of teeth and supporting structures: Secondary | ICD-10-CM | POA: Insufficient documentation

## 2014-10-10 DIAGNOSIS — Z965 Presence of tooth-root and mandibular implants: Secondary | ICD-10-CM | POA: Insufficient documentation

## 2014-10-10 DIAGNOSIS — Z720 Tobacco use: Secondary | ICD-10-CM | POA: Insufficient documentation

## 2014-10-10 MED ORDER — IBUPROFEN 800 MG PO TABS: 800.0000 mg | ORAL_TABLET | Freq: Three times a day (TID) | ORAL | Status: DC

## 2014-10-10 MED ORDER — PENICILLIN V POTASSIUM 500 MG PO TABS: 500.0000 mg | ORAL_TABLET | Freq: Three times a day (TID) | ORAL | Status: DC

## 2014-10-10 NOTE — Discharge Instructions (Signed)

## 2014-10-10 NOTE — ED Notes (Signed)
Pt c/o right lower dental pain starting 2 days ago. Does not have a regular dentist. Unsure if tooth is broken or decayed. No other c/c.

## 2014-10-10 NOTE — ED Provider Notes (Signed)
CSN: [id number]     Arrival date & time 10/10/14  2116 History  This chart was scribed for non-physician practitioner, Charlann Lange, PA-C, working with Davonna Belling, MD, by Stephania Fragmin, ED Scribe. This patient was seen in room [place] and the patient's care was started at 10:44 PM.    Chief Complaint  Patient presents with  . Dental Pain   The history is provided by the patient. No language interpreter was used.     HPI Comments: Donald Carr is a 29 y.o. male who presents to the Emergency Department complaining of right lower dental pain that began 2 days ago. He denies following up with dentists for prior dental issues. He denies any known dental fracture. Patient tried taking Tramadol and Vicodin with minimal relief. He denies fever or facial swelling. Patient has NKDA.  Past Medical History  Diagnosis Date  . Chlamydia 2010    treated  . Bronchitis    History reviewed. No pertinent past surgical history. History reviewed. No pertinent family history. History  Substance Use Topics  . Smoking status: Current Every Day Smoker -- 1.00 packs/day for 10 years    Types: Cigarettes  . Smokeless tobacco: Never Used  . Alcohol Use: 0.0 oz/week    3-4 drink(s) per week     Comment: daily    Review of Systems  Constitutional: Negative for fever.  HENT: Positive for dental problem. Negative for facial swelling.     Allergies  Review of patient's allergies indicates no known allergies.  Home Medications   Prior to Admission medications   Medication Sig Start Date End Date Taking? Authorizing Provider  albuterol (PROVENTIL HFA;VENTOLIN HFA) 108 (90 BASE) MCG/ACT inhaler Inhale 1-2 puffs into the lungs every 6 (six) hours as needed for wheezing. 05/16/12  Yes Harden Mo, MD  HYDROcodone-acetaminophen (NORCO/VICODIN) 5-325 MG per tablet Take 1-2 tablets by mouth every 4 (four) hours as needed for moderate pain or severe pain. Patient not taking: Reported on 10/10/2014  02/17/14   Alvina Chou, PA-C  oxyCODONE-acetaminophen (PERCOCET/ROXICET) 5-325 MG per tablet Take 2 tablets by mouth every 4 (four) hours as needed for severe pain. Patient not taking: Reported on 10/10/2014 09/20/13   Domenic Moras, PA-C  penicillin v potassium (VEETID) 500 MG tablet Take 1 tablet (500 mg total) by mouth 3 (three) times daily. Patient not taking: Reported on 10/10/2014 09/20/13   Domenic Moras, PA-C  penicillin v potassium (VEETID) 500 MG tablet Take 1 tablet (500 mg total) by mouth 4 (four) times daily. Patient not taking: Reported on 10/10/2014 02/17/14   Kaitlyn Szekalski, PA-C   BP 123/80 mmHg  Pulse 77  Temp(Src) 98.2 F (36.8 C) (Oral)  Resp 16  SpO2 98% Physical Exam  Constitutional: He is oriented to person, place, and time. He appears well-developed and well-nourished. No distress.  HENT:  Head: Normocephalic and atraumatic.  Generally good dentition with tenderness along #31. No significant swelling, pointing abscess, or active drainage. No facial swelling.  Eyes: Conjunctivae and EOM are normal.  Neck: Neck supple. No tracheal deviation present.  Cardiovascular: Normal rate.   Pulmonary/Chest: Effort normal. No respiratory distress.  Musculoskeletal: Normal range of motion.  Lymphadenopathy:    He has no cervical adenopathy.  Neurological: He is alert and oriented to person, place, and time.  Skin: Skin is warm and dry.  Psychiatric: He has a normal mood and affect. His behavior is normal.  Nursing note and vitals reviewed.   ED Course  Procedures (  including critical care time)  DIAGNOSTIC STUDIES: Oxygen Saturation is 98% on RA, normal by my interpretation.    COORDINATION OF CARE: 10:46 PM - Discussed treatment plan with pt at bedside which includes antibiotics and anti-inflammatories, and pt agreed to plan. Will also give dentist resource list/information.    MDM   Final diagnoses:  None  1. Dental pain  Resources provided for outpatient dental  care. Ibuprofen, penicillin provided by prescription. Stable for discharge home.   I personally performed the services described in this documentation, which was scribed in my presence. The recorded information has been reviewed and is accurate.      Charlann Lange, PA-C 10/11/14 [place], MD 10/11/14 [phone]

## 2014-12-11 ENCOUNTER — Emergency Department (HOSPITAL_COMMUNITY)
Admission: EM | Admit: 2014-12-11 | Discharge: 2014-12-11 | Disposition: A | Discharge status: Home / Self Care | Payer: BLUE CROSS/BLUE SHIELD | Attending: Emergency Medicine | Admitting: Emergency Medicine

## 2014-12-11 ENCOUNTER — Encounter (HOSPITAL_COMMUNITY): Payer: Self-pay | Admitting: Oncology

## 2014-12-11 DIAGNOSIS — Z8619 Personal history of other infectious and parasitic diseases: Secondary | ICD-10-CM | POA: Insufficient documentation

## 2014-12-11 DIAGNOSIS — Z720 Tobacco use: Secondary | ICD-10-CM | POA: Diagnosis not present

## 2014-12-11 DIAGNOSIS — Z8709 Personal history of other diseases of the respiratory system: Secondary | ICD-10-CM | POA: Diagnosis not present

## 2014-12-11 DIAGNOSIS — K088 Other specified disorders of teeth and supporting structures: Secondary | ICD-10-CM | POA: Insufficient documentation

## 2014-12-11 DIAGNOSIS — K0889 Other specified disorders of teeth and supporting structures: Secondary | ICD-10-CM

## 2014-12-11 MED ORDER — MELOXICAM 15 MG PO TABS: 15.0000 mg | ORAL_TABLET | Freq: Every day | ORAL | Status: DC

## 2014-12-11 NOTE — Discharge Instructions (Signed)
Take mobic as needed for pain. Follow up with your dentist for further evaluation.

## 2014-12-11 NOTE — ED Provider Notes (Signed)
CSN: [id number]     Arrival date & time 12/11/14  2229 History   First MD Initiated Contact with Patient 12/11/14 2254     Chief Complaint  Patient presents with  . Dental Pain     (Consider location/radiation/quality/duration/timing/severity/associated sxs/prior Treatment) HPI Comments: The patient is a 29 year old otherwise healthy male who presents with dental pain that started gradually 1 week ago. The dental pain is severe, constant and progressively worsening. The pain is aching and located in right lower jaw. The pain does not radiate. Eating makes the pain worse. Nothing makes the pain better. The patient saw his dentist today and prescribed antibiotics and tylenol #3 which provides no relief. . No associated symptoms. Patient denies headache, neck pain/stiffness, fever, NVD, edema, sore throat, throat swelling, wheezing, SOB, chest pain, abdominal pain.     Patient is a 29 y.o. male presenting with tooth pain.  Dental Pain Associated symptoms: no fever and no neck pain     Past Medical History  Diagnosis Date  . Chlamydia 2010    treated  . Bronchitis    History reviewed. No pertinent past surgical history. No family history on file. History  Substance Use Topics  . Smoking status: Current Every Day Smoker -- 1.00 packs/day for 10 years    Types: Cigarettes  . Smokeless tobacco: Never Used  . Alcohol Use: 0.0 oz/week    3-4 drink(s) per week     Comment: daily    Review of Systems  Constitutional: Negative for fever, chills and fatigue.  HENT: Positive for dental problem. Negative for trouble swallowing.   Eyes: Negative for visual disturbance.  Respiratory: Negative for shortness of breath.   Cardiovascular: Negative for chest pain and palpitations.  Gastrointestinal: Negative for nausea, vomiting, abdominal pain and diarrhea.  Genitourinary: Negative for dysuria and difficulty urinating.  Musculoskeletal: Negative for arthralgias and neck pain.  Skin: Negative  for color change.  Neurological: Negative for dizziness and weakness.  Psychiatric/Behavioral: Negative for dysphoric mood.      Allergies  Review of patient's allergies indicates no known allergies.  Home Medications   Prior to Admission medications   Medication Sig Start Date End Date Taking? Authorizing Provider  acetaminophen-codeine (TYLENOL #3) 300-30 MG per tablet Take 1 tablet by mouth every 4 (four) hours as needed for moderate pain.   Yes Historical Provider, MD  albuterol (PROVENTIL HFA;VENTOLIN HFA) 108 (90 BASE) MCG/ACT inhaler Inhale 1-2 puffs into the lungs every 6 (six) hours as needed for wheezing. Patient not taking: Reported on 12/11/2014 05/16/12   Harden Mo, MD  HYDROcodone-acetaminophen (NORCO/VICODIN) 5-325 MG per tablet Take 1-2 tablets by mouth every 4 (four) hours as needed for moderate pain or severe pain. Patient not taking: Reported on 10/10/2014 02/17/14   Alvina Chou, PA-C  ibuprofen (ADVIL,MOTRIN) 800 MG tablet Take 1 tablet (800 mg total) by mouth 3 (three) times daily. Patient not taking: Reported on 12/11/2014 10/10/14   Charlann Lange, PA-C  oxyCODONE-acetaminophen (PERCOCET/ROXICET) 5-325 MG per tablet Take 2 tablets by mouth every 4 (four) hours as needed for severe pain. Patient not taking: Reported on 10/10/2014 09/20/13   Domenic Moras, PA-C  penicillin v potassium (VEETID) 500 MG tablet Take 1 tablet (500 mg total) by mouth 3 (three) times daily. Patient not taking: Reported on 12/11/2014 10/10/14   Charlann Lange, PA-C   BP 132/89 mmHg  Pulse 71  Temp(Src) 98.3 F (36.8 C) (Oral)  Ht 5\' 6"  (1.676 m)  Wt 130 lb (  58.968 kg)  BMI 20.99 kg/m2  SpO2 99% Physical Exam  Constitutional: He appears well-developed and well-nourished. No distress.  HENT:  Head: Normocephalic and atraumatic.  Fair dentition. Right lower 2nd molar tender to percussion. No abscess noted.   Eyes: Conjunctivae and EOM are normal.  Neck: Normal range of motion.   Cardiovascular: Normal rate and regular rhythm.  Exam reveals no gallop and no friction rub.   No murmur heard. Pulmonary/Chest: Effort normal and breath sounds normal. He has no wheezes. He has no rales. He exhibits no tenderness.  Abdominal: Soft. He exhibits no distension. There is no tenderness. There is no rebound.  Musculoskeletal: Normal range of motion.  Neurological: He is alert. Coordination normal.  Speech is goal-oriented. Moves limbs without ataxia.   Skin: Skin is warm and dry.  Psychiatric: He has a normal mood and affect. His behavior is normal.  Nursing note and vitals reviewed.   ED Course  NERVE BLOCK Date/Time: 12/11/2014 11:39 PM Performed by: Alvina Chou Authorized by: Alvina Chou Consent: Verbal consent obtained. Consent given by: patient Patient understanding: patient states understanding of the procedure being performed Patient consent: the patient's understanding of the procedure matches consent given Patient identity confirmed: verbally with patient Time out: Immediately prior to procedure a "time out" was called to verify the correct patient, procedure, equipment, support staff and site/side marked as required. Indications: pain relief Body area: face/mouth Nerve: inferior alveolar Laterality: right Patient sedated: no Patient position: sitting Needle gauge: 25 G Location technique: anatomical landmarks Local anesthetic: bupivacaine 0.5% without epinephrine Anesthetic total: 2 ml Outcome: pain improved Patient tolerance: Patient tolerated the procedure well with no immediate complications   (including critical care time) Labs Review Labs Reviewed - No data to display  Imaging Review No results found.   EKG Interpretation None      MDM   Final diagnoses:  Pain, dental    11:36 PM Patient given dental block which he reports improved his pain. Patient has no signs of ludwigs angina and has dental follow up. Patient will  have mobic for pain. He is instructed to return with worsening or concerning symptoms. Vitals stable and patient afebrile.   Alvina Chou, PA-C 12/11/14 0347  Linton Flemings, MD 12/12/14 [phone]

## 2014-12-11 NOTE — ED Notes (Signed)
Per pt he has had right lower tooth pain x 1 month.  Pt saw dentist yesterday and was given tylenol 3 that did not help his pain.  Pt attempted to call dentist today however no one was in the office.  Per pt his dentist told him he had an abscess and was started on an antibiotic.

## 2015-06-14 ENCOUNTER — Emergency Department (HOSPITAL_COMMUNITY)
Admission: EM | Admit: 2015-06-14 | Discharge: 2015-06-14 | Disposition: A | Discharge status: Home / Self Care | Payer: BLUE CROSS/BLUE SHIELD | Attending: Emergency Medicine | Admitting: Emergency Medicine

## 2015-06-14 ENCOUNTER — Encounter (HOSPITAL_COMMUNITY): Payer: Self-pay | Admitting: Emergency Medicine

## 2015-06-14 DIAGNOSIS — R112 Nausea with vomiting, unspecified: Secondary | ICD-10-CM | POA: Insufficient documentation

## 2015-06-14 DIAGNOSIS — R197 Diarrhea, unspecified: Secondary | ICD-10-CM | POA: Insufficient documentation

## 2015-06-14 DIAGNOSIS — R1011 Right upper quadrant pain: Secondary | ICD-10-CM | POA: Insufficient documentation

## 2015-06-14 DIAGNOSIS — Z8619 Personal history of other infectious and parasitic diseases: Secondary | ICD-10-CM | POA: Insufficient documentation

## 2015-06-14 DIAGNOSIS — Z8709 Personal history of other diseases of the respiratory system: Secondary | ICD-10-CM | POA: Insufficient documentation

## 2015-06-14 DIAGNOSIS — R1013 Epigastric pain: Secondary | ICD-10-CM | POA: Insufficient documentation

## 2015-06-14 DIAGNOSIS — F17210 Nicotine dependence, cigarettes, uncomplicated: Secondary | ICD-10-CM | POA: Insufficient documentation

## 2015-06-14 DIAGNOSIS — Z79899 Other long term (current) drug therapy: Secondary | ICD-10-CM | POA: Insufficient documentation

## 2015-06-14 LAB — URINALYSIS, ROUTINE W REFLEX MICROSCOPIC
GLUCOSE, UA: NEGATIVE mg/dL
KETONES UR: 15 mg/dL — AB
LEUKOCYTES UA: NEGATIVE
Nitrite: NEGATIVE
PH: 6 (ref 5.0–8.0)
PROTEIN: 30 mg/dL — AB
Specific Gravity, Urine: 1.035 — ABNORMAL HIGH (ref 1.005–1.030)

## 2015-06-14 LAB — CBC
HEMATOCRIT: 41.8 % (ref 39.0–52.0)
Hemoglobin: 13.9 g/dL (ref 13.0–17.0)
MCH: 31.1 pg (ref 26.0–34.0)
MCHC: 33.3 g/dL (ref 30.0–36.0)
MCV: 93.5 fL (ref 78.0–100.0)
PLATELETS: 299 10*3/uL (ref 150–400)
RBC: 4.47 MIL/uL (ref 4.22–5.81)
RDW: 14.2 % (ref 11.5–15.5)
WBC: 11.4 10*3/uL — AB (ref 4.0–10.5)

## 2015-06-14 LAB — URINE MICROSCOPIC-ADD ON

## 2015-06-14 LAB — COMPREHENSIVE METABOLIC PANEL
ALBUMIN: 5 g/dL (ref 3.5–5.0)
ALT: 20 U/L (ref 17–63)
AST: 34 U/L (ref 15–41)
Alkaline Phosphatase: 92 U/L (ref 38–126)
Anion gap: 12 (ref 5–15)
BUN: 15 mg/dL (ref 6–20)
CHLORIDE: 98 mmol/L — AB (ref 101–111)
CO2: 27 mmol/L (ref 22–32)
CREATININE: 0.93 mg/dL (ref 0.61–1.24)
Calcium: 9.6 mg/dL (ref 8.9–10.3)
GFR calc Af Amer: >60 mL/min (ref >60)
GLUCOSE: 98 mg/dL (ref 65–99)
POTASSIUM: 4.2 mmol/L (ref 3.5–5.1)
Sodium: 137 mmol/L (ref 135–145)
Total Bilirubin: 1 mg/dL (ref 0.3–1.2)
Total Protein: 8.7 g/dL — ABNORMAL HIGH (ref 6.5–8.1)

## 2015-06-14 LAB — LIPASE, BLOOD: LIPASE: 22 U/L (ref 11–51)

## 2015-06-14 MED ORDER — DICYCLOMINE HCL 10 MG PO CAPS
10.0000 mg | ORAL_CAPSULE | Freq: Once | ORAL | Status: AC
  Administered 2015-06-14: 10 mg via ORAL
  Filled 2015-06-14: qty 1

## 2015-06-14 MED ORDER — ONDANSETRON HCL 4 MG PO TABS
4.0000 mg | ORAL_TABLET | Freq: Once | ORAL | Status: AC
  Administered 2015-06-14: 4 mg via ORAL
  Filled 2015-06-14: qty 1

## 2015-06-14 MED ORDER — ONDANSETRON HCL 4 MG PO TABS: 4.0000 mg | ORAL_TABLET | Freq: Four times a day (QID) | ORAL | Status: DC

## 2015-06-14 MED ORDER — DICYCLOMINE HCL 20 MG PO TABS: 20.0000 mg | ORAL_TABLET | Freq: Two times a day (BID) | ORAL | Status: DC

## 2015-06-14 NOTE — ED Provider Notes (Signed)
CSN: [id number]     Arrival date & time 06/14/15  1158 History   First MD Initiated Contact with Patient 06/14/15 1701     Chief Complaint  Patient presents with  . Abdominal Pain   HPI Donald Carr is a 31 y.o. M presenting with a 1 day history of upper abdominal pain, N/V/D. He describes his pain as epigastric and RUQ, non-radiating, 10/10 pain scale, intermittent, not associated with BM or eating. He denies fevers, chills, recent abx use, ill contacts, raw/undercooked foods.   Past Medical History  Diagnosis Date  . Chlamydia 2010    treated  . Bronchitis    History reviewed. No pertinent past surgical history. History reviewed. No pertinent family history. Social History  Substance Use Topics  . Smoking status: Current Every Day Smoker -- 1.00 packs/day for 10 years    Types: Cigarettes  . Smokeless tobacco: Never Used  . Alcohol Use: 0.0 oz/week    3-4 drink(s) per week     Comment: daily    Review of Systems Ten systems are reviewed and are negative for acute change except as noted in the HPI  Allergies  Review of patient's allergies indicates no known allergies.  Home Medications   Prior to Admission medications   Medication Sig Start Date End Date Taking? Authorizing Provider  acetaminophen-codeine (TYLENOL #3) 300-30 MG per tablet Take 1 tablet by mouth every 4 (four) hours as needed for moderate pain.    Historical Provider, MD  albuterol (PROVENTIL HFA;VENTOLIN HFA) 108 (90 BASE) MCG/ACT inhaler Inhale 1-2 puffs into the lungs every 6 (six) hours as needed for wheezing. Patient not taking: Reported on 12/11/2014 05/16/12   Harden Mo, MD  HYDROcodone-acetaminophen (NORCO/VICODIN) 5-325 MG per tablet Take 1-2 tablets by mouth every 4 (four) hours as needed for moderate pain or severe pain. Patient not taking: Reported on 10/10/2014 02/17/14   Alvina Chou, PA-C  ibuprofen (ADVIL,MOTRIN) 800 MG tablet Take 1 tablet (800 mg total) by mouth 3 (three) times  daily. Patient not taking: Reported on 12/11/2014 10/10/14   Charlann Lange, PA-C  meloxicam (MOBIC) 15 MG tablet Take 1 tablet (15 mg total) by mouth daily. 12/11/14   Alvina Chou, PA-C  oxyCODONE-acetaminophen (PERCOCET/ROXICET) 5-325 MG per tablet Take 2 tablets by mouth every 4 (four) hours as needed for severe pain. Patient not taking: Reported on 10/10/2014 09/20/13   Domenic Moras, PA-C  penicillin v potassium (VEETID) 500 MG tablet Take 1 tablet (500 mg total) by mouth 3 (three) times daily. Patient not taking: Reported on 12/11/2014 10/10/14   Charlann Lange, PA-C   BP 131/100 mmHg  Pulse 82  Temp(Src) 98 F (36.7 C) (Oral)  Resp 18  SpO2 100% Physical Exam  Constitutional: He appears well-developed and well-nourished. No distress.  HENT:  Head: Normocephalic and atraumatic.  Mouth/Throat: Oropharynx is clear and moist. No oropharyngeal exudate.  Eyes: Conjunctivae are normal. Pupils are equal, round, and reactive to light. Right eye exhibits no discharge. Left eye exhibits no discharge. No scleral icterus.  Neck: No tracheal deviation present.  Cardiovascular: Normal rate, regular rhythm, normal heart sounds and intact distal pulses.  Exam reveals no gallop and no friction rub.   No murmur heard. Pulmonary/Chest: Effort normal and breath sounds normal. No respiratory distress. He has no wheezes. He has no rales. He exhibits no tenderness.  Abdominal: Soft. Bowel sounds are normal. He exhibits no distension and no mass. There is no tenderness. There is no rebound and  no guarding.  No abdominal or CVA tenderness on exam.   Musculoskeletal: He exhibits no edema.  Lymphadenopathy:    He has no cervical adenopathy.  Neurological: He is alert. Coordination normal.  Skin: Skin is warm and dry. No rash noted. He is not diaphoretic. No erythema.  Psychiatric: He has a normal mood and affect. His behavior is normal.  Nursing note and vitals reviewed.   ED Course  Procedures  Labs  Review Labs Reviewed  COMPREHENSIVE METABOLIC PANEL - Abnormal; Notable for the following:    Chloride 98 (*)    Total Protein 8.7 (*)    All other components within normal limits  CBC - Abnormal; Notable for the following:    WBC 11.4 (*)    All other components within normal limits  URINALYSIS, ROUTINE W REFLEX MICROSCOPIC (NOT AT [facility]) - Abnormal; Notable for the following:    Color, Urine AMBER (*)    Specific Gravity, Urine 1.035 (*)    Hgb urine dipstick SMALL (*)    Bilirubin Urine SMALL (*)    Ketones, ur 15 (*)    Protein, ur 30 (*)    All other components within normal limits  URINE MICROSCOPIC-ADD ON - Abnormal; Notable for the following:    Squamous Epithelial / LPF 0-5 (*)    Bacteria, UA RARE (*)    All other components within normal limits  LIPASE, BLOOD   MDM   Final diagnoses:  Nausea and vomiting, vomiting of unspecified type   Patient non-toxic appearing and VSS. Hypertensive on exam. No abdominal tenderness. Imaging not indicated. Based on patient history and physical exam, most likely etiologies are viral gastroenteritis. Less likely etiologies include pancreatitis, MI, GERD, gallbladder, KS.  CMP unremarkable. CBC with slight, nonspecific leukocytosis of 11.4.  UA with small hgb, small bilirubin, 15 ketones, 30 protein. Not indicative of infection.  Normal lipase.  Upon reevaluation, patient's nausea has improved with zofran. Abdominal pain slightly improved with bentyl. Patient tolerating PO fluids.  Patient may be safely discharged home with bentyl and zofran. Discussed reasons for return. Patient to follow-up with primary care provider within one week. Patient in understanding and agreement with the plan.    Staples Lions, PA-C 06/14/15 Creston Liu, MD 06/15/15 [phone]

## 2015-06-14 NOTE — ED Notes (Signed)
Pt c/o intermittent middle abdominal pain, emesis, diarrhea x 1 day.

## 2015-06-14 NOTE — Discharge Instructions (Signed)
Donald Carr,  Nice meeting you! Please follow-up with your primary care provider. See the attached list for other providers if you do not have one. Return to the emergency department if you develop fevers, chills, are unable to keep fluids/foods down, or have increasing abdominal pain. Feel better soon!  S. Wendie Simmer, PA-C  Emergency Department Resource Guide 1) Find a Doctor and Pay Out of Pocket Although you won't have to find out who is covered by your insurance plan, it is a good idea to ask around and get recommendations. You will then need to call the office and see if the doctor you have chosen will accept you as a new patient and what types of options they offer for patients who are self-pay. Some doctors offer discounts or will set up payment plans for their patients who do not have insurance, but you will need to ask so you aren't surprised when you get to your appointment.  2) Contact Your Local Health Department Not all health departments have doctors that can see patients for sick visits, but many do, so it is worth a call to see if yours does. If you don't know where your local health department is, you can check in your phone book. The CDC also has a tool to help you locate your state's health department, and many state websites also have listings of all of their local health departments.  3) Find a [facility] If your illness is not likely to be very severe or complicated, you may want to try a walk in clinic. These are popping up all over the country in pharmacies, drugstores, and shopping centers. They're usually staffed by nurse practitioners or physician assistants that have been trained to treat common illnesses and complaints. They're usually fairly quick and inexpensive. However, if you have serious medical issues or chronic medical problems, these are probably not your best option.  No Primary Care Doctor: - Call Health Connect at  [phone] - they can help you  locate a primary care doctor that  accepts your insurance, provides certain services, etc. - Physician Referral Service- [phone]  Chronic Pain Problems: Organization         Address  Phone   Notes  [facility]  [phone] Patients need to be referred by their primary care doctor.   Medication Assistance: Organization         Address  Phone   Notes  [facility] Medication [facility] [address] [place], [place] [address] [phone] --Must be a resident of [facility] -- Must have NO insurance coverage whatsoever (no Medicaid/ Medicare, etc.) -- The pt. MUST have a primary care doctor that directs their care regularly and follows them in the community   MedAssist  [phone]   [employer]  [phone]    Agencies that provide inexpensive medical care: Organization         Address  Phone   Notes  [place]  [phone]   [facility]    [phone]   [facility] [place], [place] [address] [phone]   [place] [address], [place] [phone]   Planned Parenthood    [phone]   [facility]    [phone]   [place] and [place] [address], [place] Phone:  [phone], Fax:  [phone] Hours of Operation:  9 am - 6 pm, M-F.  Also accepts Medicaid/Medicare and self-pay.  [facility] for [place], [address], [place] Phone: [phone], Fax: [phone]. Hours of Operation:  8:30 am - 5:30 pm, M-F.  Also accepts Medicaid and self-pay.  [facility] [address] [place] Phone: [phone]   [place], [place], [place] [phone], Ext. [phone] Mondays & Thursdays: 7-9 AM.  First 15 patients are seen on a first come, first serve basis.    [place]  Providers:  Organization         Address  Phone   Notes  [facility] [address], [place] [phone] Also accepts self-pay patients.  [facility] [address], [place]  [phone]   [place], [address], [place] [phone]   [facility] [address], [place] [phone]   Lucianne Lei [address], [place]   [phone] Only accepts [place] Access [place] patients after they have their name applied to their card.   Self-Pay (no insurance) in [facility]:  Organization         Address  Phone   Notes  Sickle Cell Patients, [facility] [place] [phone]   [facility] [place] [phone]   [facility] [place]  [place], [place], [place] [phone]   [facility]/Dr. Osei-Bonsu  [address], [place] or [address], [place] [phone] Phone number for both [place] and [place] locations is the same.  Urgent Medical and [facility] [address], [place] [phone]   [facility] [address] [place] or [address] [phone]   [facility] [address], [place] [phone], phone; [phone], fax Sees patients 1st and 3rd Saturday of every month.  Must not qualify for public or private insurance (i.e. Medicaid, Medicare, [place] Health Choice, Veterans' Benefits)  Household income should be no more than 200% of the poverty level The clinic cannot treat you if you are pregnant or think you are pregnant  Sexually transmitted diseases are not treated at the clinic.    Dental Care: Organization         Address  Phone  Notes  [facility] Department of [facility] [place] [phone] Accepts children up to age 38 who are enrolled in [place] or [place]; pregnant women with a Medicaid card; and children who have applied for Medicaid or [place] Health Choice, but were declined, whose parents can pay a reduced fee at time of service.  [facility] Department of [facility]  [address], [place] [phone] Accepts children up to age 17 who are enrolled in [place] or [place]; pregnant women with a Medicaid card; and children who have applied for Medicaid or [place] Health Choice, but were declined, whose parents can pay a reduced fee at time of service.  [facility] PRO[facility]  [place] [phone] Patients are seen by appointment only. Walk-ins are not accepted. [place]  [facility]ental will see patients 53 years of age and older. Monday - Tuesday (8am-5pm) Most Wednesdays (8:30-5pm) $30 per visit, cash only  [facility] Adult [facility] Access PRO[facility]  [address], [facility] [phone] Patients are seen by appointment only. Walk-ins are not accepted. [place] will see patients 4 years of age and older. One Wednesday Evening (Monthly: Volunteer Based).  $30 per visit, cash only  [place]  [phone] for adults; Children under age 50, call Graduate Pediatric Dentistry at [phone]. Children aged 68-14, please call [phone] to request a pediatric application.  Dental services are provided in all areas of dental care including fillings, crowns and bridges, complete and partial dentures, implants, gum treatment, root canals, and extractions. Preventive care is also provided. Treatment is provided to both adults and children. Patients are selected via a lottery and there is often a waiting list.   [facility] [address], [place]  [phone] [email or web address]   [facility]  [address] [place], [place] [phone], Ext. [phone] Second and Fourth Thursday of each month, opens at 6:30 AM; Clinic ends at 9 AM.  Patients are seen on a first-come first-served basis, and a limited number are seen during each clinic.   [facility]  [address] Hillard Danker [place], [place] [phone]   Eligibility Requirements You must have lived in [place], [place], or [place] counties for at least the last three months.   You cannot be eligible for state or federal sponsored [employer], including [employer], [place], or [employer].   You generally cannot be eligible for healthcare insurance through your employer.    How to apply: Eligibility screenings are held every Tuesday and Wednesday afternoon from 1:00 pm until 4:00 pm. You do not need an appointment for the interview!  [facility] [address] [place], [place]   [place]  [place] Department  [place]  [phone]    Behavioral Health Resources in the Community: Intensive Outpatient Programs Organization         Address  Phone  Notes  [place]. [address] [place], [place] [phone]   [facility] [address], [place], [place]   ADS: Alcohol & Drug Svcs [address] [place], [place]   [place] [address]  [place], [place] or [phone]   Substance Abuse Resources Organization         Address  Phone  Notes  Alcohol and Drug Services  [phone]   [place]  [phone]   [facility] [place]   Chinita Pester  [phone]   Residential & Outpatient Substance Abuse Program  [phone]   Psychological Services Organization         Address  Phone  Notes  [facility] [place]  [place]  [phone]   [place] [address] [place] or [phone]    Mobile Crisis Teams Organization         Address  Phone  Notes  [facility], Mobile Crisis Care Unit  [phone]   [facility]  [address] [place], [place]   [facility] [address] [place] [phone]    Self-Help/Support Groups Organization  Address  Phone             Notes  [place]. of [place] - variety of support groups  Green Call for more information  Narcotics Anonymous (NA), [facility] [address], [employer]   2 meetings at this location   Special educational needs teacher         Address  Phone  Notes  [facility] [place],    [place]  [phone]   [facility]  [address] [place] [phone], [place], [place]   [place], [place] [phone] Admissions: 8am-3pm M-F  Incentives Substance [place] [address]    [place], [place] [phone]   [facility] [address] [place], [place], [place]   [facility] [address]  [place], [place]   [facility] - Intensive Outpatient [address], [place], [place]   [facility] [place].,  [place], [place] [phone] or [phone]   [facility] Services (RTS) [address], [place], [place] Accepts Medicaid  [facility] [place] [address],  [place] [phone] Substance Abuse/Addiction Treatment   [facility] Organization         Address  Phone  Notes  [facility]  [phone]   Domenic Schwab, PhD [address] Arlis [place], [place]   [phone] or [phone]    [place], [place] [phone]   [facility] [address], [place], [place] [phone] Insurance/Medicaid/sponsorship through [facility] and Families [address] [place]                                    [place], [place] [phone] [place] [address], [place] [phone]    Dr. Adele Schilder  [phone]   [facility] Dept. 1) [address], [place] 2) [place] 3)  [place] [address], [place] [phone]  [phone]   [place] [phone] or [phone] (After Hours)        Nausea and Vomiting Nausea is a sick feeling that often comes before throwing up (vomiting). Vomiting is a reflex where stomach contents come out of your mouth. Vomiting can cause severe loss of body fluids (dehydration). Children and elderly adults can become dehydrated quickly, especially if they also have diarrhea. Nausea and vomiting are symptoms of a condition or disease. It is important to find the cause of your symptoms. CAUSES   Direct irritation of the stomach lining. This irritation can result from increased acid production (gastroesophageal reflux disease), infection, food poisoning, taking certain medicines (such as nonsteroidal anti-inflammatory drugs), alcohol use, or tobacco use.  Signals from the brain.These signals could be caused by a headache, heat exposure, an inner ear disturbance, increased pressure in the brain from injury, infection, a tumor, or a concussion, pain, emotional stimulus, or metabolic problems.  An obstruction in the gastrointestinal tract (bowel obstruction).  Illnesses such as diabetes, hepatitis, gallbladder problems, appendicitis, kidney problems, cancer, sepsis, atypical symptoms of a heart attack, or eating disorders.  Medical treatments  such as chemotherapy and radiation.  Receiving medicine that makes you sleep (general anesthetic) during surgery. DIAGNOSIS Your caregiver may ask for tests to be done if the problems do not improve after a few days. Tests may also be done if symptoms are severe or if the reason for the nausea and vomiting is not clear. Tests may include:  Urine tests.  Blood tests.  Stool tests.  Cultures (to look for evidence of infection).  X-rays or other imaging studies. Test results can help your caregiver make decisions about treatment or the need for additional tests. TREATMENT You need to stay well hydrated. Drink frequently but in small amounts.You may wish to drink water, sports drinks, clear broth, or eat frozen ice pops or gelatin dessert to help stay hydrated.When you eat, eating slowly may help prevent nausea.There are also some antinausea medicines that may help prevent nausea. HOME CARE INSTRUCTIONS   Take all medicine as directed by your caregiver.  If you do not have an appetite, do not force yourself to eat. However, you must continue to drink fluids.  If you have an appetite, eat a normal diet unless your caregiver tells you differently.  Eat a variety of complex carbohydrates (rice, wheat, potatoes, bread), lean meats, yogurt, fruits, and vegetables.  Avoid high-fat foods because they are more difficult to digest.  Drink enough water and fluids to keep your urine clear or pale yellow.  If you are dehydrated, ask your caregiver for specific rehydration instructions. Signs of dehydration may include:  Severe thirst.  Dry lips and mouth.  Dizziness.  Dark urine.  Decreasing urine frequency and amount.  Confusion.  Rapid breathing or pulse. SEEK IMMEDIATE MEDICAL CARE IF:   You have blood or brown flecks (like coffee grounds) in your vomit.  You have black or bloody stools.  You have a severe headache or stiff neck.  You are confused.  You have severe  abdominal pain.  You have chest pain or trouble breathing.  You do not urinate at least once every 8 hours.  You develop cold or clammy skin.  You continue to vomit for longer than 24 to 48 hours.  You have a fever. MAKE SURE YOU:   Understand these instructions.  Will watch your condition.  Will get help right away if you are not doing well or get worse.   This information is not intended to replace advice given to you by your health care provider. Make sure you discuss any questions you have with your health care provider.   Document Released: 05/15/2005 Document Revised: 08/07/2011 Document Reviewed: 10/12/2010 Elsevier Interactive Patient Education [employer].

## 2015-06-14 NOTE — ED Notes (Signed)
Patient not in room from triage.

## 2015-06-14 NOTE — ED Notes (Signed)
Discharge instructions, rx x2, and follow up care reviewed with patient. Patient verbalized understanding.

## 2016-05-14 ENCOUNTER — Emergency Department (HOSPITAL_COMMUNITY)
Admission: EM | Admit: 2016-05-14 | Discharge: 2016-05-14 | Disposition: A | Discharge status: Home / Self Care | Payer: BLUE CROSS/BLUE SHIELD | Attending: Emergency Medicine | Admitting: Emergency Medicine

## 2016-05-14 ENCOUNTER — Encounter (HOSPITAL_COMMUNITY): Payer: Self-pay | Admitting: Emergency Medicine

## 2016-05-14 DIAGNOSIS — Z79899 Other long term (current) drug therapy: Secondary | ICD-10-CM | POA: Insufficient documentation

## 2016-05-14 DIAGNOSIS — F17210 Nicotine dependence, cigarettes, uncomplicated: Secondary | ICD-10-CM | POA: Insufficient documentation

## 2016-05-14 DIAGNOSIS — Z720 Tobacco use: Secondary | ICD-10-CM

## 2016-05-14 DIAGNOSIS — K0889 Other specified disorders of teeth and supporting structures: Secondary | ICD-10-CM

## 2016-05-14 DIAGNOSIS — K0381 Cracked tooth: Secondary | ICD-10-CM

## 2016-05-14 MED ORDER — PENICILLIN V POTASSIUM 500 MG PO TABS: 1000.0000 mg | ORAL_TABLET | Freq: Two times a day (BID) | ORAL | 0 refills | Status: DC

## 2016-05-14 NOTE — Discharge Instructions (Signed)
Apply warm compresses to jaw throughout the day. Take antibiotic until finished. Use tylenol or motrin as needed for pain. Perform salt water swishes to help with pain/swelling. Use over the counter oragel as needed for additional relief. STOP SMOKING! Followup with a dentist is very important for ongoing evaluation and management of recurrent dental pain, call the dentist listed above in the next 24-48 hours to schedule ongoing dental care. Return to emergency department for emergent changing or worsening symptoms.

## 2016-05-14 NOTE — ED Triage Notes (Signed)
Pt here with dental pain x 3 days

## 2016-05-14 NOTE — ED Provider Notes (Signed)
[facility] DEPT Provider Note   CSN: [id number] Arrival date & time: 05/14/16  0714     History   Chief Complaint Chief Complaint  Patient presents with  . Dental Pain    HPI Donald Carr is a 30 y.o. male with a PMHx of bronchitis, who presents to the ED with complaints of left lower dental pain after cracking of tooth 4 days ago. Patient describes the pain as 5/10 intermittent throbbing nonradiating left lower dental pain, worse with chewing, and improved with ibuprofen. Patient has seen a dentist earlier this year for a similar issue on a different tooth, and is already scheduled to see a dentist in January for his current broken tooth. He admits to being a cigarette smoker. He denies trismus, drooling, ear pain/drainage, sore throat, gum swelling/drainage, fevers, chills, CP, SOB, abd pain, N/V/D/C, hematuria, dysuria, myalgias, arthralgias, numbness, tingling, weakness, or any other complaints at this time.    The history is provided by the patient and medical records. No language interpreter was used.  Dental Pain   This is a new problem. The current episode started more than 2 days ago. The problem occurs daily. The problem has not changed since onset.The pain is at a severity of 5/10. The pain is mild. Treatments tried: ibuprofen. The treatment provided moderate relief.    Past Medical History:  Diagnosis Date  . Bronchitis   . Chlamydia 2010   treated    There are no active problems to display for this patient.   History reviewed. No pertinent surgical history.     Home Medications    Prior to Admission medications   Medication Sig Start Date End Date Taking? Authorizing Provider  albuterol (PROVENTIL HFA;VENTOLIN HFA) 108 (90 BASE) MCG/ACT inhaler Inhale 1-2 puffs into the lungs every 6 (six) hours as needed for wheezing. 05/16/12   Harden Mo, MD  dicyclomine (BENTYL) 20 MG tablet Take 1 tablet (20 mg total) by mouth 2 (two) times daily. 06/14/15    [place] Lions, PA-C  HYDROcodone-acetaminophen (NORCO/VICODIN) 5-325 MG per tablet Take 1-2 tablets by mouth every 4 (four) hours as needed for moderate pain or severe pain. Patient not taking: Reported on 10/10/2014 02/17/14   Alvina Chou, PA-C  ibuprofen (ADVIL,MOTRIN) 800 MG tablet Take 1 tablet (800 mg total) by mouth 3 (three) times daily. Patient not taking: Reported on 12/11/2014 10/10/14   Charlann Lange, PA-C  meloxicam (MOBIC) 15 MG tablet Take 1 tablet (15 mg total) by mouth daily. 12/11/14   Kaitlyn Szekalski, PA-C  ondansetron (ZOFRAN) 4 MG tablet Take 1 tablet (4 mg total) by mouth every 6 (six) hours. 06/14/15   [place] Lions, PA-C  oxyCODONE-acetaminophen (PERCOCET/ROXICET) 5-325 MG per tablet Take 2 tablets by mouth every 4 (four) hours as needed for severe pain. Patient not taking: Reported on 10/10/2014 09/20/13   Domenic Moras, PA-C    Family History History reviewed. No pertinent family history.  Social History Social History  Substance Use Topics  . Smoking status: Current Every Day Smoker    Packs/day: 1.00    Years: 10.00    Types: Cigarettes  . Smokeless tobacco: Never Used  . Alcohol use 0.0 oz/week    3 - 4 drink(s) per week     Comment: daily     Allergies   Patient has no known allergies.   Review of Systems Review of Systems  Constitutional: Negative for chills and fever.  HENT: Positive for dental problem. Negative for drooling,  ear discharge, ear pain, sore throat and trouble swallowing.   Respiratory: Negative for shortness of breath.   Cardiovascular: Negative for chest pain.  Gastrointestinal: Negative for abdominal pain, constipation, diarrhea, nausea and vomiting.  Genitourinary: Negative for dysuria and hematuria.  Musculoskeletal: Negative for arthralgias and myalgias.  Skin: Negative for color change.  Allergic/Immunologic: Negative for immunocompromised state.  Neurological: Negative for weakness and numbness.    Psychiatric/Behavioral: Negative for confusion.   10 Systems reviewed and are negative for acute change except as noted in the HPI.   Physical Exam Updated Vital Signs BP 129/85 (BP Location: Right Arm)   Pulse 74   Temp 98.2 F (36.8 C) (Oral)   Resp 18   SpO2 99%   Physical Exam  Constitutional: He is oriented to person, place, and time. Vital signs are normal. He appears well-developed and well-nourished.  Non-toxic appearance. No distress.  Afebrile, nontoxic, NAD  HENT:  Head: Normocephalic and atraumatic.  Nose: Nose normal.  Mouth/Throat: Uvula is midline, oropharynx is clear and moist and mucous membranes are normal. No trismus in the jaw. Abnormal dentition. No dental abscesses or uvula swelling. Tonsils are 0 on the right. Tonsils are 0 on the left. No tonsillar exudate.    L lower tooth #20 with anterior portion broken off, remaining tooth loose but still attached to base; surrounding gingiva erythematous and irritated appearing, mildly swollen, without associated abscess; no evidence of ludwig's. Nose clear. Oropharynx clear and moist, without uvular swelling or deviation, no trismus or drooling, no tonsillar swelling or erythema, no exudates.    Eyes: Conjunctivae and EOM are normal. Right eye exhibits no discharge. Left eye exhibits no discharge.  Neck: Normal range of motion. Neck supple.  Cardiovascular: Normal rate and intact distal pulses.   Pulmonary/Chest: Effort normal. No respiratory distress.  Abdominal: Normal appearance. He exhibits no distension.  Musculoskeletal: Normal range of motion.  Neurological: He is alert and oriented to person, place, and time. He has normal strength. No sensory deficit.  Skin: Skin is warm, dry and intact. No rash noted.  Psychiatric: He has a normal mood and affect.  Nursing note and vitals reviewed.    ED Treatments / Results  Labs (all labs ordered are listed, but only abnormal results are displayed) Labs Reviewed - No  data to display  EKG  EKG Interpretation None       Radiology No results found.  Procedures Procedures (including critical care time)  Medications Ordered in ED Medications - No data to display   Initial Impression / Assessment and Plan / ED Course  I have reviewed the triage vital signs and the nursing notes.  Pertinent labs & imaging results that were available during my care of the patient were reviewed by me and considered in my medical decision making (see chart for details).  Clinical Course     30 y.o. male here with Dental pain associated with broken tooth, no associated abscess but gums appear inflamed and irritated, with patient afebrile, non toxic appearing and swallowing secretions well, no evidence of ludwig's. I gave patient referral to dentist and stressed the importance of dental follow up for ultimate management of dental pain.  I have also discussed reasons to return immediately to the ER.  Patient expresses understanding and agrees with plan.  I will also give PCN VK. Discussed OTC meds for pain control. Smoking cessation advised.   Final Clinical Impressions(s) / ED Diagnoses   Final diagnoses:  Cracked tooth  Pain, dental  Tobacco user    New Prescriptions New Prescriptions   PENICILLIN V POTASSIUM (VEETID) 500 MG TABLET    Take 2 tablets (1,000 mg total) by mouth 2 (two) times daily. X 7 days     [employer], PA-C 05/14/16 [id number]    Pattricia Boss, MD 05/15/16 [phone]

## 2017-07-27 ENCOUNTER — Encounter (HOSPITAL_COMMUNITY): Payer: Self-pay

## 2017-07-27 ENCOUNTER — Other Ambulatory Visit: Payer: Self-pay

## 2017-07-27 ENCOUNTER — Emergency Department (HOSPITAL_COMMUNITY)
Admission: EM | Admit: 2017-07-27 | Discharge: 2017-07-27 | Disposition: A | Discharge status: Home / Self Care | Payer: BLUE CROSS/BLUE SHIELD | Attending: Emergency Medicine | Admitting: Emergency Medicine

## 2017-07-27 ENCOUNTER — Emergency Department (HOSPITAL_COMMUNITY)
Admission: EM | Admit: 2017-07-27 | Discharge: 2017-07-27 | Disposition: A | Discharge status: Home / Self Care | Payer: BLUE CROSS/BLUE SHIELD

## 2017-07-27 DIAGNOSIS — F17210 Nicotine dependence, cigarettes, uncomplicated: Secondary | ICD-10-CM | POA: Insufficient documentation

## 2017-07-27 DIAGNOSIS — L723 Sebaceous cyst: Secondary | ICD-10-CM | POA: Insufficient documentation

## 2017-07-27 MED ORDER — BACITRACIN ZINC 500 UNIT/GM EX OINT: TOPICAL_OINTMENT | Freq: Two times a day (BID) | CUTANEOUS | Status: DC

## 2017-07-27 MED ORDER — LIDOCAINE-EPINEPHRINE 2 %-1:100000 IJ SOLN
1.7000 mL | Freq: Once | INTRAMUSCULAR | Status: DC
  Filled 2017-07-27: qty 1.7

## 2017-07-27 MED ORDER — CEPHALEXIN 500 MG PO CAPS: 500.0000 mg | ORAL_CAPSULE | Freq: Four times a day (QID) | ORAL | 0 refills | Status: AC

## 2017-07-27 NOTE — ED Notes (Signed)
Called x 3 for Triage. No answer

## 2017-07-27 NOTE — ED Triage Notes (Signed)
Patient c/o left facial abscess x 1 month and larger in the past 2 days.

## 2017-07-27 NOTE — ED Provider Notes (Signed)
[facility] DEPT Provider Note   CSN: [id number] Arrival date & time: 07/27/17  1409     History   Chief Complaint Chief Complaint  Patient presents with  . Abscess    HPI Donald Carr is a 32 y.o. male.  HPI   Patient is a 32 year old male who presents the ED today complaining of a swollen area to the left side of his face that has been present for the last 3 weeks.  States that area is painful.  States that swelling/pain has worsened about 3-4 days ago and the pain also became worse.  He denies any drainage from the area denies any redness.  Denies any recent fevers.  Denies headaches, neck stiffness, neck pain, vision changes, or any other symptoms associated with this.  He has not tried taking any medications for symptoms.  States he is never had symptoms like this before denies any other alleviating/aggravating factors.  States pain 10/10, and constant.  Past Medical History:  Diagnosis Date  . Bronchitis   . Chlamydia 2010   treated    There are no active problems to display for this patient.   History reviewed. No pertinent surgical history.     Home Medications    Prior to Admission medications   Medication Sig Start Date End Date Taking? Authorizing Provider  albuterol (PROVENTIL HFA;VENTOLIN HFA) 108 (90 BASE) MCG/ACT inhaler Inhale 1-2 puffs into the lungs every 6 (six) hours as needed for wheezing. 05/16/12   Harden Mo, MD  cephALEXin (KEFLEX) 500 MG capsule Take 1 capsule (500 mg total) by mouth 4 (four) times daily for 7 days. 07/27/17 08/03/17  Lailana Shira S, PA-C  dicyclomine (BENTYL) 20 MG tablet Take 1 tablet (20 mg total) by mouth 2 (two) times daily. 06/14/15   Millvale Lions, PA-C  HYDROcodone-acetaminophen (NORCO/VICODIN) 5-325 MG per tablet Take 1-2 tablets by mouth every 4 (four) hours as needed for moderate pain or severe pain. Patient not taking: Reported on 10/10/2014 02/17/14   Alvina Chou,  PA-C  ibuprofen (ADVIL,MOTRIN) 800 MG tablet Take 1 tablet (800 mg total) by mouth 3 (three) times daily. Patient not taking: Reported on 12/11/2014 10/10/14   Charlann Lange, PA-C  meloxicam (MOBIC) 15 MG tablet Take 1 tablet (15 mg total) by mouth daily. 12/11/14   Szekalski, Kaitlyn, PA-C  ondansetron (ZOFRAN) 4 MG tablet Take 1 tablet (4 mg total) by mouth every 6 (six) hours. 06/14/15   [place] Lions, PA-C  oxyCODONE-acetaminophen (PERCOCET/ROXICET) 5-325 MG per tablet Take 2 tablets by mouth every 4 (four) hours as needed for severe pain. Patient not taking: Reported on 10/10/2014 09/20/13   Domenic Moras, PA-C  penicillin v potassium (VEETID) 500 MG tablet Take 2 tablets (1,000 mg total) by mouth 2 (two) times daily. X 7 days 05/14/16   [address], [place], [place]    Family History History reviewed. No pertinent family history.  Social History Social History   Tobacco Use  . Smoking status: Current Every Day Smoker    Packs/day: 1.00    Years: 10.00    Pack years: 10.00    Types: Cigarettes  . Smokeless tobacco: Never Used  Substance Use Topics  . Alcohol use: Yes    Alcohol/week: 0.0 oz    Types: 3 - 4 Standard drinks or equivalent per week    Comment: daily  . Drug use: Yes    Types: Marijuana     Allergies   Patient has no known  allergies.   Review of Systems Review of Systems  Constitutional: Negative for fever.  Eyes: Negative for visual disturbance.  Musculoskeletal: Negative for neck pain and neck stiffness.  Skin:       Swelling/abscess to left side of face  Neurological: Negative for dizziness, weakness, light-headedness and headaches.     Physical Exam Updated Vital Signs BP (!) 152/86 (BP Location: Right Arm)   Pulse 96   Temp 98.5 F (36.9 C) (Oral)   Resp 18   Ht 5\' 7"  (1.702 m)   Wt 56.7 kg (125 lb)   SpO2 97%   BMI 19.58 kg/m   Physical Exam  Constitutional: He is oriented to person, place, and time. He appears well-developed and  well-nourished. No distress.  HENT:  1cm raised area to left side of face along zygomatic arch, that feels fluctuant and consistent with cyst. Raised area is well circumscribed and mobile. No surrounding erythema or evidence of cellulitis. No drainage. Mild ttp.  No trismus.  Eyes: Conjunctivae are normal.  Cardiovascular: Normal rate and regular rhythm.  Pulmonary/Chest: Effort normal and breath sounds normal.  Neurological: He is alert and oriented to person, place, and time. No cranial nerve deficit.  Skin: Skin is warm and dry.     ED Treatments / Results  Labs (all labs ordered are listed, but only abnormal results are displayed) Labs Reviewed - No data to display  EKG  EKG Interpretation None       Radiology No results found.  Procedures .Marland KitchenIncision and Drainage Date/Time: 07/27/2017 4:43 PM Performed by: Rodney Booze, PA-C Authorized by: Rodney Booze, PA-C   Consent:    Consent obtained:  Verbal   Consent given by:  Patient   Risks discussed:  Bleeding and incomplete drainage Location:    Type:  Cyst   Size:  1   Location: left side of face. Pre-procedure details:    Skin preparation:  Betadine Anesthesia (see MAR for exact dosages):    Anesthesia method:  None Procedure type:    Complexity:  Simple Procedure details:    Incision types:  Stab incision   Incision depth:  Dermal   Scalpel blade:  11   Wound management:  Irrigated with saline   Drainage:  Bloody   Drainage amount:  Scant   Wound treatment:  Wound left open   Packing materials:  None Post-procedure details:    Patient tolerance of procedure:  Tolerated well, no immediate complications   (including critical care time)  Medications Ordered in ED Medications  bacitracin ointment (not administered)     Initial Impression / Assessment and Plan / ED Course  I have reviewed the triage vital signs and the nursing notes.  Pertinent labs & imaging results that were available during  my care of the patient were reviewed by me and considered in my medical decision making (see chart for details).     Final Clinical Impressions(s) / ED Diagnoses   Final diagnoses:  Sebaceous cyst    32 y/o male presenting to the ED c/o a swollen area to the left side of his face for the last several weeks. VSS.  Afebrile. .  Nontoxic-appearing.  Exam consistent with sebaceous cyst to the left side of the face.  Area was attempted to be incised and drained, however no drainage was noted on exam.  Will send patient home with a course of Keflex and advised to follow-up for wound recheck within 1 week with either primary care urgent  care if sxs not resolving.  Advised to return sooner to the ED if he notices any signs of infection or has any worsening symptoms.  All questions were answered and patient understands plan.  ED Discharge Orders        Ordered    cephALEXin (KEFLEX) 500 MG capsule  4 times daily     07/27/17 [address] PA-C 07/27/17 1644    Davonna Belling, MD 07/27/17 1651

## 2017-07-27 NOTE — Discharge Instructions (Addendum)
You were given a prescription for antibiotics. Please take the antibiotic prescription fully.   I have prescribed a new medication for you today. It is important that when you pick the prescription up you discuss the potential interactions of this medication with other medications you are taking, including over the counter medications, with the pharmacists.   This new medication has potential side effects. Be sure to contact your primary care provider or return to the emergency department if you are experiencing new symptoms that you are unable to tolerate after starting the medication. You need to receive medical evaluation immediately if you start to experience blistering of the skin, rash, swelling, or difficulty breathing as these signs could indicate a more serious medication side effect.   Please follow up with your primary care provider within 5-7 days for re-evaluation of your symptoms. If you do not have a primary care provider, information for a healthcare clinic has been provided for you to make arrangements for follow up care.   Please return to the emergency room immediately if you experience any new or worsening symptoms or any symptoms that indicate worsening infection such as fevers, increased redness/swelling/pain, warmth, or drainage from the affected area.

## 2019-07-18 ENCOUNTER — Ambulatory Visit: Payer: Commercial Managed Care - PPO | Admitting: Podiatry

## 2019-07-18 ENCOUNTER — Encounter: Payer: Self-pay | Admitting: Podiatry

## 2019-07-18 ENCOUNTER — Ambulatory Visit (INDEPENDENT_AMBULATORY_CARE_PROVIDER_SITE_OTHER): Payer: Commercial Managed Care - PPO

## 2019-07-18 ENCOUNTER — Other Ambulatory Visit: Payer: Self-pay

## 2019-07-18 VITALS — BP 139/102 | HR 83

## 2019-07-18 DIAGNOSIS — L84 Corns and callosities: Secondary | ICD-10-CM

## 2019-07-18 DIAGNOSIS — M7751 Other enthesopathy of right foot: Secondary | ICD-10-CM

## 2019-07-18 DIAGNOSIS — M2041 Other hammer toe(s) (acquired), right foot: Secondary | ICD-10-CM | POA: Insufficient documentation

## 2019-07-18 NOTE — Progress Notes (Signed)
This patient presents the office with chief complaint of a painful corn between his fourth and fifth toes right foot.  He says this painful corn has been present for over 1 year.  He says he was initially seen by Dr. Gershon Mussel who trims his corn but the corn has returned in the last 6 months.  He presents the office today for an evaluation and treatment of this painful corn  Vascular  Dorsalis pedis and posterior tibial pulses are palpable  B/L.  Capillary return  WNL.  Temperature gradient is  WNL.  Skin turgor  WNL  Sensorium  Senn Weinstein monofilament wire  WNL. Normal tactile sensation.  Nail Exam  Patient has normal nails with no evidence of bacterial or fungal infection.  Orthopedic  Exam  Muscle tone and muscle strength  WNL.  No limitations of motion feet  B/L.  No crepitus or joint effusion noted.  Foot type is unremarkable and digits show no abnormalities.  Bony prominences are unremarkable.  Skin  No open lesions.  Normal skin texture and turgor.  Heloma molle 4th interspace right foot.  Heloma Molle 4th interspace right foot  IE.  Xray was taken for pre-op exam.  Patient is appointed to Dr.  Paulla Dolly for surgical consulatation.  RTC Appt.  With Dr.  Paulla Dolly.   Gardiner Barefoot DPM

## 2019-07-25 ENCOUNTER — Ambulatory Visit: Payer: Commercial Managed Care - PPO | Admitting: Podiatry

## 2019-07-25 ENCOUNTER — Other Ambulatory Visit: Payer: Self-pay

## 2019-07-25 ENCOUNTER — Encounter: Payer: Self-pay | Admitting: Podiatry

## 2019-07-25 DIAGNOSIS — L84 Corns and callosities: Secondary | ICD-10-CM | POA: Diagnosis not present

## 2019-07-25 DIAGNOSIS — M2041 Other hammer toe(s) (acquired), right foot: Secondary | ICD-10-CM

## 2019-07-25 NOTE — Patient Instructions (Signed)
Pre-Operative Instructions  Congratulations, you have decided to take an important step towards improving your quality of life.  You can be assured that the doctors and staff at [facility] will be with you every step of the way.  Here are some important things you should know:  1. Plan to be at the surgery center/hospital at least 1 (one) hour prior to your scheduled time, unless otherwise directed by the surgical center/hospital staff.  You must have a responsible adult accompany you, remain during the surgery and drive you home.  Make sure you have directions to the surgical center/hospital to ensure you arrive on time. 2. If you are having surgery at [facility] or [facility], you will need a copy of your medical history and physical form from your family physician within one month prior to the date of surgery. We will give you a form for your primary physician to complete.  3. We make every effort to accommodate the date you request for surgery.  However, there are times where surgery dates or times have to be moved.  We will contact you as soon as possible if a change in schedule is required.   4. No aspirin/ibuprofen for one week before surgery.  If you are on aspirin, any non-steroidal anti-inflammatory medications (Mobic, Aleve, Ibuprofen) should not be taken seven (7) days prior to your surgery.  You make take Tylenol for pain prior to surgery.  5. Medications - If you are taking daily heart and blood pressure medications, seizure, reflux, allergy, asthma, anxiety, pain or diabetes medications, make sure you notify the surgery center/hospital before the day of surgery so they can tell you which medications you should take or avoid the day of surgery. 6. No food or drink after midnight the night before surgery unless directed otherwise by surgical center/hospital staff. 7. No alcoholic beverages 24-hours prior to surgery.  No smoking 24-hours prior or 24-hours after  surgery. 8. Wear loose pants or shorts. They should be loose enough to fit over bandages, boots, and casts. 9. Don't wear slip-on shoes. Sneakers are preferred. 10. Bring your boot with you to the surgery center/hospital.  Also bring crutches or a walker if your physician has prescribed it for you.  If you do not have this equipment, it will be provided for you after surgery. 11. If you have not been contacted by the surgery center/hospital by the day before your surgery, call to confirm the date and time of your surgery. 12. Leave-time from work may vary depending on the type of surgery you have.  Appropriate arrangements should be made prior to surgery with your employer. 13. Prescriptions will be provided immediately following surgery by your doctor.  Fill these as soon as possible after surgery and take the medication as directed. Pain medications will not be refilled on weekends and must be approved by the doctor. 14. Remove nail polish on the operative foot and avoid getting pedicures prior to surgery. 15. Wash the night before surgery.  The night before surgery wash the foot and leg well with water and the antibacterial soap provided. Be sure to pay special attention to beneath the toenails and in between the toes.  Wash for at least three (3) minutes. Rinse thoroughly with water and dry well with a towel.  Perform this wash unless told not to do so by your physician.  Enclosed: 1 Ice pack (please put in freezer the night before surgery)   1 Hibiclens skin cleaner     Pre-op instructions  If you have any questions regarding the instructions, please do not hesitate to call our office.  [place]: [address], [place], [place] [address] -- [phone]  [place]: [address] [place], [place] [address] -- [phone]  [place]: [address], [place], [place] [address] -- [phone]   Website: [email or web address] 

## 2019-07-25 NOTE — Progress Notes (Signed)
Subjective:   Patient ID: Donald Carr, male   DOB: 34 y.o.   MRN: [id number]   HPI Patient presents stating this thing between my 2 toes is killing me and I need to have it fixed and I have had it trimmed in the past with minimal relief has been going on for several years.  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  I allowed patient to read consent form going over alternative treatments complications and patient is willing to accept risk of surgery wants to have this done and after extensive review signed consent form.  Patient scheduled for outpatient surgery as per specialty surgical center and understands total recovery can take approximately 6 months and patient is encouraged to call with questions prior to procedure  I reviewed the x-ray indicating some rotation of the fifth digit right with enlargement head of proximal phalanx

## 2019-07-28 ENCOUNTER — Telehealth: Payer: Self-pay | Admitting: Podiatry

## 2019-07-28 NOTE — Telephone Encounter (Addendum)
DOS: 08/05/2019  SURGICAL PROCEDURES: Hammertoe Repair 5th [phone]) and Webbing Procedure 4th, 5th [phone]).  [facility] Effective: 11/27/2014 -  Deductible: $2,800 with $0 met and $2,800 remains. Out of Pocket: $5,000 with $70 met and $4,930 remains. CoInsurance: 70% / 30%.  Per Ellie Lunch no prior authorization is required. Call ref# [id number].

## 2019-07-31 ENCOUNTER — Telehealth: Payer: Self-pay | Admitting: Podiatry

## 2019-07-31 NOTE — Telephone Encounter (Signed)
Pt called to reschedule sx from 03/09 due to cost. I rescheduled pt's sx to 03/23 and told him that someone from the sx center would call him a day or two prior to let him know what time to arrive.

## 2019-08-05 ENCOUNTER — Encounter: Payer: Self-pay | Admitting: Podiatry

## 2019-08-05 DIAGNOSIS — L852 Keratosis punctata (palmaris et plantaris): Secondary | ICD-10-CM

## 2019-08-05 DIAGNOSIS — M25774 Osteophyte, right foot: Secondary | ICD-10-CM | POA: Diagnosis not present

## 2019-08-05 DIAGNOSIS — D2370 Other benign neoplasm of skin of unspecified lower limb, including hip: Secondary | ICD-10-CM | POA: Diagnosis not present

## 2019-08-05 MED ORDER — HYDROCODONE-ACETAMINOPHEN 10-325 MG PO TABS: 1.0000 | ORAL_TABLET | Freq: Four times a day (QID) | ORAL | 0 refills | Status: AC | PRN

## 2019-08-05 NOTE — Addendum Note (Signed)
Addended by: Wallene Huh on: 08/05/2019 06:48 AM   Modules accepted: Orders

## 2019-08-06 ENCOUNTER — Telehealth: Payer: Self-pay | Admitting: Podiatry

## 2019-08-06 NOTE — Telephone Encounter (Signed)
[facility] did surgry 08/05/19

## 2019-08-06 NOTE — Telephone Encounter (Signed)
Pt called stating that the pain medication he has is not working for the pain. Wanted to know if he could get something stronger DOS 08/06/19

## 2019-08-06 NOTE — Telephone Encounter (Signed)
I spoke with pt and he states he is having aching pain. I informed pt removing the boot, open-ended sock, ace wrap, elevate the foot for 15 minutes, then place foot level with the hip and beginning at the base of the toes and roll the ace loosely down the foot and up the leg, replace the sock and boot. I told pt he if he could tolerate Ibuprofen he could take 800mg  OTC ibuprofen every 8 hours between the doses of the Norco. Pt states he would do that.

## 2019-08-11 ENCOUNTER — Encounter: Payer: Commercial Managed Care - PPO | Admitting: Podiatry

## 2019-08-13 ENCOUNTER — Other Ambulatory Visit: Payer: Self-pay

## 2019-08-13 ENCOUNTER — Ambulatory Visit (INDEPENDENT_AMBULATORY_CARE_PROVIDER_SITE_OTHER): Payer: Commercial Managed Care - PPO

## 2019-08-13 ENCOUNTER — Encounter: Payer: Self-pay | Admitting: Podiatrist

## 2019-08-13 ENCOUNTER — Ambulatory Visit (INDEPENDENT_AMBULATORY_CARE_PROVIDER_SITE_OTHER): Payer: Commercial Managed Care - PPO | Admitting: Podiatrist

## 2019-08-13 VITALS — BP 99/66 | HR 93 | Temp 98.8°F | Resp 16

## 2019-08-13 DIAGNOSIS — M2041 Other hammer toe(s) (acquired), right foot: Secondary | ICD-10-CM

## 2019-08-13 MED ORDER — HYDROCODONE-ACETAMINOPHEN 10-325 MG PO TABS: 1.0000 | ORAL_TABLET | Freq: Four times a day (QID) | ORAL | 0 refills | Status: DC | PRN

## 2019-08-13 NOTE — Patient Instructions (Signed)
Keep the foot dry and dressing intact until your next visit.

## 2019-08-13 NOTE — Progress Notes (Signed)
Chief Complaint  Patient presents with  . Routine Post Op    dos 03.09.2021 Hammertoe Repair 5th Rt, Webbing Procedure 4th, 5th Rt; pt stated, "Pain 9/10 when on foot; Pain 1/10 when foot is elevated; overall, foot feels fine"  . Medication Refill    Hydrocodone 10-325 mg; pt stated, "Needs a refill"    Patient presents today for post op follow up of right foot surgery dos 08/05/19.  Objective:  Neurovascular status intact and unchanged from previous visit.  Right foot is healing nicely.  Skin between toes 4,5 are well coapted with sutures in place.  No redness, now swelling, no sign of infection   Assessment/Plan:  S/p righ foot webbing procedure   Plan:  Re dressed the foot with a betadine dressing and a compressive wrap.  Instructed on continued use of surgical shoe.  rx for hydrocodone 10-325 written.  Will return in 1 week for post op follow up.

## 2019-08-25 ENCOUNTER — Encounter: Payer: Commercial Managed Care - PPO | Admitting: Podiatry

## 2019-08-27 ENCOUNTER — Telehealth: Payer: Self-pay | Admitting: Podiatry

## 2019-08-27 ENCOUNTER — Encounter: Payer: Self-pay | Admitting: Podiatry

## 2019-08-27 ENCOUNTER — Other Ambulatory Visit: Payer: Self-pay

## 2019-08-27 ENCOUNTER — Ambulatory Visit (INDEPENDENT_AMBULATORY_CARE_PROVIDER_SITE_OTHER): Payer: Commercial Managed Care - PPO | Admitting: Podiatry

## 2019-08-27 ENCOUNTER — Ambulatory Visit (INDEPENDENT_AMBULATORY_CARE_PROVIDER_SITE_OTHER): Payer: Commercial Managed Care - PPO

## 2019-08-27 VITALS — Temp 97.9°F

## 2019-08-27 DIAGNOSIS — M7751 Other enthesopathy of right foot: Secondary | ICD-10-CM

## 2019-08-27 DIAGNOSIS — M2041 Other hammer toe(s) (acquired), right foot: Secondary | ICD-10-CM

## 2019-08-27 MED ORDER — HYDROCODONE-ACETAMINOPHEN 10-325 MG PO TABS
1.0000 | ORAL_TABLET | Freq: Three times a day (TID) | ORAL | 0 refills | Status: AC | PRN
Start: 2019-08-27 — End: 2019-09-01

## 2019-08-27 NOTE — Telephone Encounter (Signed)
I called pt and asked if his pain had changed since his visit today and he stated no.

## 2019-08-27 NOTE — Addendum Note (Signed)
Addended by: Wallene Huh on: 08/27/2019 04:54 PM   Modules accepted: Orders

## 2019-08-27 NOTE — Telephone Encounter (Signed)
Patient needs to have pain med refilled.  [facility] in [facility].

## 2019-08-27 NOTE — Progress Notes (Signed)
Subjective:   Patient ID: Donald Carr, male   DOB: 34 y.o.   MRN: [id number]   HPI Patient states overall doing well with some soreness on my incision site   ROS      Objective:  Physical Exam  Neurovascular status intact with patient's fourth interspace right doing well overall slight breakdown of tissue localized no proximal edema erythema drainage noted     Assessment:  Doing well post syndactylization partial right     Plan:  H&P stitches removed slight gapping noted and apply dressing with Iodosorb to dry it out instructing him to leave this on and then to begin using padding between the toes and I gave strict instructions of any redness swelling drainage were to occur he is to let us know immediately.  Will be seen back 4 weeks or earlier if needed and stitches removed today  X-rays indicate satisfactory resection of bone no indication of pathology

## 2019-08-28 ENCOUNTER — Encounter: Payer: Commercial Managed Care - PPO | Admitting: Podiatry

## 2019-09-08 ENCOUNTER — Encounter: Payer: Commercial Managed Care - PPO | Admitting: Podiatry

## 2019-09-15 ENCOUNTER — Encounter: Payer: Commercial Managed Care - PPO | Admitting: Podiatry

## 2019-09-24 ENCOUNTER — Encounter: Payer: Self-pay | Admitting: Podiatry

## 2019-09-24 ENCOUNTER — Ambulatory Visit (INDEPENDENT_AMBULATORY_CARE_PROVIDER_SITE_OTHER): Payer: Commercial Managed Care - PPO | Admitting: Podiatry

## 2019-09-24 ENCOUNTER — Ambulatory Visit (INDEPENDENT_AMBULATORY_CARE_PROVIDER_SITE_OTHER): Payer: Commercial Managed Care - PPO

## 2019-09-24 ENCOUNTER — Other Ambulatory Visit: Payer: Self-pay

## 2019-09-24 DIAGNOSIS — M2041 Other hammer toe(s) (acquired), right foot: Secondary | ICD-10-CM

## 2019-09-24 NOTE — Progress Notes (Signed)
Subjective:   Patient ID: Donald Carr, male   DOB: 34 y.o.   MRN: [id number]   HPI Patient states I feel pretty good but states I still have some crusted tissue formation on the bottom of my foot   ROS      Objective:  Physical Exam  Neurovascular status intact with patient's plantar crusted tissue present but the area that we did surgery on is healing very well with no breakdown of tissue wound edges well coapted     Assessment:  Doing well with partial syndactylization arthroplasty with patient having natural dry skin     Plan:  Reviewed final x-ray patient may return to normal shoe gear and we will work and keratotic tissue should hopefully resolve gradually over time.  This may be a chronic problem  X-rays indicate satisfactory section head of proximal phalanx digit 5 right

## 2021-10-17 ENCOUNTER — Encounter (HOSPITAL_COMMUNITY): Payer: Self-pay

## 2021-10-17 ENCOUNTER — Emergency Department (HOSPITAL_COMMUNITY)
Admission: EM | Admit: 2021-10-17 | Discharge: 2021-10-17 | Disposition: A | Discharge status: Home / Self Care | Payer: BLUE CROSS/BLUE SHIELD | Attending: Emergency Medicine | Admitting: Emergency Medicine

## 2021-10-17 DIAGNOSIS — H6121 Impacted cerumen, right ear: Secondary | ICD-10-CM | POA: Insufficient documentation

## 2021-10-17 MED ORDER — HYDROGEN PEROXIDE 3 % EX SOLN
CUTANEOUS | Status: AC
  Filled 2021-10-17: qty 473

## 2021-10-17 NOTE — ED Triage Notes (Signed)
Pt arrived via POV, c/o muffled hearing out of right ear for a week. Denies any drainage or injury.

## 2021-10-17 NOTE — Discharge Instructions (Signed)
You came to the emergency department today to be evaluated for your right ear fullness.  You were found to have a cerumen impaction which means a buildup of earwax in your ear.  This was resolved in the emergency department.  In the future please use Debrox solution as we discussed.  Get help right away if: You have bleeding from the affected ear. You have severe ear pain.

## 2021-10-17 NOTE — ED Provider Notes (Signed)
[facility] DEPT Provider Note   CSN: [id number] Arrival date & time: 10/17/21  1127     History  Chief Complaint  Patient presents with   Ear Fullness    Donald Carr is a 36 y.o. male with no pertinent past medical history.  Presents emergency department the chief complaint of right ear fullness.  Patient reports that he woke up last Sunday 5/14 with fullness to his right ear.  Patient states that this has been constant since then.  Patient reports decreased hearing due to this fullness.    Patient denies any recent travel by airplane, barotrauma, ear trauma, swimming, fever, chills, otalgia, otorrhea, neck pain, neck stiffness, fever, chills, rhinorrhea, nasal congestion.   Ear Fullness      Home Medications Prior to Admission medications   Medication Sig Start Date End Date Taking? Authorizing Provider  HYDROcodone-acetaminophen (NORCO) 10-325 MG tablet Take 1 tablet by mouth every 6 (six) hours as needed. 08/13/19   Edrick Kins, DPM      Allergies    Patient has no known allergies.    Review of Systems   Review of Systems  Constitutional:  Negative for chills and fever.  HENT:  Positive for hearing loss. Negative for congestion, ear discharge, ear pain, sinus pressure, sinus pain and sore throat.   Musculoskeletal:  Negative for neck pain and neck stiffness.   Physical Exam Updated Vital Signs BP (!) 145/89 (BP Location: Left Arm)   Pulse 92   Temp 98.3 F (36.8 C) (Oral)   Resp 20   SpO2 97%  Physical Exam Vitals and nursing note reviewed.  Constitutional:      General: He is not in acute distress.    Appearance: He is not ill-appearing, toxic-appearing or diaphoretic.  HENT:     Head: Normocephalic.     Right Ear: External ear normal. There is impacted cerumen.     Left Ear: Tympanic membrane, ear canal and external ear normal.     Ears:     Comments: On initial assessment patient has impacted cerumen to right ear  unable to visualize TM Eyes:     General: No scleral icterus.       Right eye: No discharge.        Left eye: No discharge.  Cardiovascular:     Rate and Rhythm: Normal rate.  Pulmonary:     Effort: Pulmonary effort is normal.  Skin:    General: Skin is warm and dry.  Neurological:     General: No focal deficit present.     Mental Status: He is alert.  Psychiatric:        Behavior: Behavior is cooperative.    ED Results / Procedures / Treatments   Labs (all labs ordered are listed, but only abnormal results are displayed) Labs Reviewed - No data to display  EKG None  Radiology No results found.  Procedures .Ear Cerumen Removal  Date/Time: 10/17/2021 1:33 PM Performed by: Loni Beckwith, PA-C Authorized by: Loni Beckwith, PA-C   Consent:    Consent obtained:  Verbal   Consent given by:  Patient   Risks discussed:  Bleeding, infection, pain, TM perforation, incomplete removal and dizziness   Alternatives discussed:  No treatment Universal protocol:    Procedure explained and questions answered to patient or proxy's satisfaction: yes     Patient identity confirmed:  Verbally with patient Procedure details:    Location:  R ear   Procedure  type: curette     Procedure outcomes: cerumen removed   Post-procedure details:    Inspection:  Some cerumen remaining   Procedure completion:  Tolerated well, no immediate complications    Medications Ordered in ED Medications - No data to display  ED Course/ Medical Decision Making/ A&P                           Medical Decision Making  Alert 36 year old male in no acute distress, nontoxic-appearing.  Presents to the ED with a chief complaint of right ear fullness.  Information obtained from patient.  Past medical records were reviewed including previous provider notes.  On physical exam patient has impacted cerumen to right ear.  Suspect this may be cause of patient's ear fullness and decreased hearing.  I  attempted to remove impaction at bedside with lighted curette.  Was able to remove the substantial amount of cerumen however patient has further impaction.  We will attempt disimpaction with warm water/hydroperoxide mixed performed by nurse at bedside.  I reevaluated patient after irrigation with warm water and hydroperoxide mixture.  Cerumen impaction resolved.  Patient reports resolution of his ear fullness and decreased hearing.  Discussed further management with [facility] as needed.  Patient to be discharged at this time.  Based on patient's chief complaint, I considered admission might be necessary, however after reassuring ED workup feel patient is reasonable for discharge.  Discussed results, findings, treatment and follow up. Patient advised of return precautions. Patient verbalized understanding and agreed with plan.  Portions of this note were generated with Lobbyist. Dictation errors may occur despite best attempts at proofreading.         Final Clinical Impression(s) / ED Diagnoses Final diagnoses:  None    Rx / DC Orders ED Discharge Orders     None         Dyann Ruddle 10/17/21 1333    Hayden Rasmussen, MD 10/17/21 1924

## 2021-11-06 ENCOUNTER — Emergency Department (HOSPITAL_COMMUNITY)
Admission: EM | Admit: 2021-11-06 | Discharge: 2021-11-07 | Disposition: A | Discharge status: Home / Self Care | Payer: Self-pay | Attending: Emergency Medicine | Admitting: Emergency Medicine

## 2021-11-06 ENCOUNTER — Other Ambulatory Visit: Payer: Self-pay

## 2021-11-06 ENCOUNTER — Encounter (HOSPITAL_COMMUNITY): Payer: Self-pay

## 2021-11-06 DIAGNOSIS — R197 Diarrhea, unspecified: Secondary | ICD-10-CM | POA: Insufficient documentation

## 2021-11-06 DIAGNOSIS — R1013 Epigastric pain: Secondary | ICD-10-CM | POA: Insufficient documentation

## 2021-11-06 DIAGNOSIS — R112 Nausea with vomiting, unspecified: Secondary | ICD-10-CM | POA: Insufficient documentation

## 2021-11-06 LAB — URINALYSIS, ROUTINE W REFLEX MICROSCOPIC
Bacteria, UA: NONE SEEN
Bilirubin Urine: NEGATIVE
Glucose, UA: NEGATIVE mg/dL
Ketones, ur: 80 mg/dL — AB
Nitrite: NEGATIVE
Protein, ur: 30 mg/dL — AB
Specific Gravity, Urine: 1.026 (ref 1.005–1.030)
pH: 5 (ref 5.0–8.0)

## 2021-11-06 LAB — COMPREHENSIVE METABOLIC PANEL
ALT: 50 U/L — ABNORMAL HIGH (ref 0–44)
AST: 52 U/L — ABNORMAL HIGH (ref 15–41)
Albumin: 5 g/dL (ref 3.5–5.0)
Alkaline Phosphatase: 97 U/L (ref 38–126)
Anion gap: 17 — ABNORMAL HIGH (ref 5–15)
BUN: 26 mg/dL — ABNORMAL HIGH (ref 6–20)
CO2: 23 mmol/L (ref 22–32)
Calcium: 10.2 mg/dL (ref 8.9–10.3)
Chloride: 102 mmol/L (ref 98–111)
Creatinine, Ser: 0.93 mg/dL (ref 0.61–1.24)
GFR, Estimated: >60 mL/min (ref >60)
Glucose, Bld: 112 mg/dL — ABNORMAL HIGH (ref 70–99)
Potassium: 4.1 mmol/L (ref 3.5–5.1)
Sodium: 142 mmol/L (ref 135–145)
Total Bilirubin: 0.7 mg/dL (ref 0.3–1.2)
Total Protein: 9.3 g/dL — ABNORMAL HIGH (ref 6.5–8.1)

## 2021-11-06 LAB — CBC
HCT: 39.3 % (ref 39.0–52.0)
Hemoglobin: 13.3 g/dL (ref 13.0–17.0)
MCH: 30.9 pg (ref 26.0–34.0)
MCHC: 33.8 g/dL (ref 30.0–36.0)
MCV: 91.4 fL (ref 80.0–100.0)
Platelets: 376 10*3/uL (ref 150–400)
RBC: 4.3 MIL/uL (ref 4.22–5.81)
RDW: 13.2 % (ref 11.5–15.5)
WBC: 14.9 10*3/uL — ABNORMAL HIGH (ref 4.0–10.5)
nRBC: 0 % (ref 0.0–0.2)

## 2021-11-06 LAB — LIPASE, BLOOD: Lipase: 19 U/L (ref 11–51)

## 2021-11-06 MED ORDER — FAMOTIDINE IN NACL 20-0.9 MG/50ML-% IV SOLN
20.0000 mg | Freq: Once | INTRAVENOUS | Status: AC
  Administered 2021-11-06: 20 mg via INTRAVENOUS
  Filled 2021-11-06: qty 50

## 2021-11-06 MED ORDER — ONDANSETRON HCL 4 MG/2ML IJ SOLN
4.0000 mg | Freq: Once | INTRAMUSCULAR | Status: AC
  Administered 2021-11-06: 4 mg via INTRAVENOUS
  Filled 2021-11-06: qty 2

## 2021-11-06 MED ORDER — SODIUM CHLORIDE 0.9 % IV BOLUS
2000.0000 mL | Freq: Once | INTRAVENOUS | Status: AC
  Administered 2021-11-06: 2000 mL via INTRAVENOUS

## 2021-11-06 NOTE — ED Notes (Addendum)
Unsuccessful lab draw in left a/c due to pt jumping during stick. Triage RN notified.

## 2021-11-06 NOTE — ED Triage Notes (Signed)
Pt presents to ED from home with c/o N/V/D that began earlier today. Pt endorses generalized abdominal pain 9/10.

## 2021-11-06 NOTE — ED Notes (Signed)
Pt requesting ice chips - advised pt that he is NPO

## 2021-11-07 MED ORDER — ONDANSETRON 4 MG PO TBDP: 4.0000 mg | ORAL_TABLET | Freq: Three times a day (TID) | ORAL | 0 refills | Status: AC | PRN

## 2021-11-07 MED ORDER — ALUM & MAG HYDROXIDE-SIMETH 200-200-20 MG/5ML PO SUSP
30.0000 mL | Freq: Once | ORAL | Status: AC
  Administered 2021-11-07: 30 mL via ORAL
  Filled 2021-11-07: qty 30

## 2021-11-07 MED ORDER — FAMOTIDINE 20 MG PO TABS: 20.0000 mg | ORAL_TABLET | Freq: Two times a day (BID) | ORAL | 0 refills | Status: AC | PRN

## 2021-11-07 MED ORDER — DICYCLOMINE HCL 20 MG PO TABS: 20.0000 mg | ORAL_TABLET | Freq: Three times a day (TID) | ORAL | 0 refills | Status: AC | PRN

## 2021-11-07 MED ORDER — DICYCLOMINE HCL 10 MG PO CAPS
20.0000 mg | ORAL_CAPSULE | Freq: Once | ORAL | Status: AC
  Administered 2021-11-07: 20 mg via ORAL
  Filled 2021-11-07: qty 2

## 2021-11-07 MED ORDER — LIDOCAINE VISCOUS HCL 2 % MT SOLN
15.0000 mL | Freq: Once | OROMUCOSAL | Status: AC
  Administered 2021-11-07: 15 mL via ORAL
  Filled 2021-11-07: qty 15

## 2021-11-07 NOTE — Discharge Instructions (Addendum)
You were seen in the emergency department today for vomiting, diarrhea, and abdominal pain.  Your labs show that your blood cell count and protein levels as well as your liver function tests are mildly elevated, please have this checked by your primary care provider, your liver function test have been elevated in the past, today they are better than they were when elevated previously.  Your urine showed findings of dehydration, please have this rechecked as well.  You were given fluids and supportive medicines in the emergency department.  We are sending you home with the following medicines: - Zofran: Take every 8 hours as needed for nausea and vomiting - Bentyl: Take every 8 hours as needed for abdominal cramping/spasms/pain - Pepcid: Take every 12 hours as needed for abdominal pain.  We have prescribed you new medication(s) today. Discuss the medications prescribed today with your pharmacist as they can have adverse effects and interactions with your other medicines including over the counter and prescribed medications. Seek medical evaluation if you start to experience new or abnormal symptoms after taking one of these medicines, seek care immediately if you start to experience difficulty breathing, feeling of your throat closing, facial swelling, or rash as these could be indications of a more serious allergic reaction   Follow attached diet guidelines.  Please drink plenty of electrolyte containing fluids.  Follow-up with primary care in 3 days.  Return to the emergency department for new or worsening symptoms including but not limited to new or worsening pain, inability to keep fluids down, fever, dark/tarry stool, blood in your stool, coffee-ground appearing vomit, passing out, dizziness, or any other concerns.

## 2021-11-07 NOTE — ED Provider Notes (Signed)
[facility] DEPT Provider Note   CSN: [id number] Arrival date & time: 11/06/21  2052     History  Chief Complaint  Patient presents with   Emesis    Donald Carr is a 36 y.o. male with a history of tobacco abuse who presents to the emergency department with complaints of N/V/D today.  Patient reports greater than 10 episodes of vomiting and diarrhea throughout the day today, no significant blood present, he is having associated generalized abdominal pain which is intermittent, worse with vomiting and diarrhea.  No other alleviating or grading factors.  No intervention prior to arrival.  He denies fever, chills, hematemesis, melena, hematochezia, or dysuria.  He drank some alcohol last night, none today.  Denies drug use.  Denies recent foreign travel, antibiotics, or suspicious p.o. intake.  HPI     Home Medications Prior to Admission medications   Medication Sig Start Date End Date Taking? Authorizing Provider  HYDROcodone-acetaminophen (NORCO) 10-325 MG tablet Take 1 tablet by mouth every 6 (six) hours as needed. 08/13/19   Edrick Kins, DPM      Allergies    Patient has no known allergies.    Review of Systems   Review of Systems  Constitutional:  Negative for chills and fever.  Respiratory:  Negative for shortness of breath.   Cardiovascular:  Negative for chest pain.  Gastrointestinal:  Positive for abdominal pain, diarrhea, nausea and vomiting.  Genitourinary:  Negative for dysuria.  Neurological:  Negative for syncope.  All other systems reviewed and are negative.   Physical Exam Updated Vital Signs BP (!) 130/92   Pulse 76   Temp 97.8 F (36.6 C) (Oral)   Resp 16   Ht '5\' 6"'$  (1.676 m)   Wt 63.5 kg   SpO2 99%   BMI 22.60 kg/m  Physical Exam Vitals and nursing note reviewed.  Constitutional:      General: He is not in acute distress.    Appearance: He is well-developed. He is not toxic-appearing.  HENT:     Head:  Normocephalic and atraumatic.  Eyes:     General:        Right eye: No discharge.        Left eye: No discharge.     Conjunctiva/sclera: Conjunctivae normal.  Cardiovascular:     Rate and Rhythm: Normal rate and regular rhythm.  Pulmonary:     Effort: No respiratory distress.     Breath sounds: Normal breath sounds. No wheezing or rales.  Abdominal:     General: There is no distension.     Palpations: Abdomen is soft.     Tenderness: There is abdominal tenderness (Mild epigastric). There is no guarding or rebound.  Musculoskeletal:     Cervical back: Neck supple.  Skin:    General: Skin is warm and dry.  Neurological:     Mental Status: He is alert.     Comments: Clear speech.   Psychiatric:        Behavior: Behavior normal.     ED Results / Procedures / Treatments   Labs (all labs ordered are listed, but only abnormal results are displayed) Labs Reviewed  COMPREHENSIVE METABOLIC PANEL - Abnormal; Notable for the following components:      Result Value   Glucose, Bld 112 (*)    BUN 26 (*)    Total Protein 9.3 (*)    AST 52 (*)    ALT 50 (*)    Anion  gap 17 (*)    All other components within normal limits  CBC - Abnormal; Notable for the following components:   WBC 14.9 (*)    All other components within normal limits  URINALYSIS, ROUTINE W REFLEX MICROSCOPIC - Abnormal; Notable for the following components:   Hgb urine dipstick SMALL (*)    Ketones, ur 80 (*)    Protein, ur 30 (*)    Leukocytes,Ua SMALL (*)    All other components within normal limits  LIPASE, BLOOD    EKG None  Radiology No results found.  Procedures Procedures    Medications Ordered in ED Medications  alum & mag hydroxide-simeth (MAALOX/MYLANTA) 200-200-20 MG/5ML suspension 30 mL (has no administration in time range)    And  lidocaine (XYLOCAINE) 2 % viscous mouth solution 15 mL (has no administration in time range)  dicyclomine (BENTYL) capsule 20 mg (has no administration in time  range)  sodium chloride 0.9 % bolus 2,000 mL (2,000 mLs Intravenous New Bag/Given 11/06/21 2337)  ondansetron (ZOFRAN) injection 4 mg (4 mg Intravenous Given 11/06/21 2337)  famotidine (PEPCID) IVPB 20 mg premix (20 mg Intravenous New Bag/Given 11/06/21 2337)    ED Course/ Medical Decision Making/ A&P                           Medical Decision Making Amount and/or Complexity of Data Reviewed Labs: ordered.  Risk OTC drugs. Prescription drug management.   Patient presents to the ED with complaints of abdominal pain. Patient nontoxic appearing, in no apparent distress, vitals with intermittent mild bradycardia/HTN, doubt Htn emergency, normal HR on my exam. On exam patient with mild epigastric TTP, no peritoneal signs. Will evaluate with labs. Anti-acid, anti-emetics, and fluids administered.   Additional history obtained:  Additional history obtained from chart review & nursing note review.   Lab Tests:  I Ordered, reviewed, and interpreted labs, which included:  CBC: Mild leukocytosis CMP: Elevated LFTs however improved from prior, elevated protein level, no critical electrolyte derangement, BUN is elevated, creatinine preserved.  Suspect dehydration. Lipase: Within normal limits UA: Ketonuria, proteinuria, and hemoglobin present.  ED Course:  01:30: RE-EVAL: Patient is feeling much better, tolerating PO. On repeat abdominal exam patient abdomen is nontender & remains without peritoneal signs, low suspicion for cholecystitis, pancreatitis, diverticulitis, appendicitis, bowel obstruction/perforation, or other acute surgical process. Favor viral GI illness. Will discharge home with supportive measures. I discussed results, treatment plan, need for PCP follow-up, and return precautions with the patient. Provided opportunity for questions, patient confirmed understanding and is in agreement with plan.    Portions of this note were generated with Lobbyist. Dictation errors  may occur despite best attempts at proofreading.  Final Clinical Impression(s) / ED Diagnoses Final diagnoses:  Nausea vomiting and diarrhea    Rx / DC Orders ED Discharge Orders          Ordered    ondansetron (ZOFRAN-ODT) 4 MG disintegrating tablet  Every 8 hours PRN        11/07/21 0135    famotidine (PEPCID) 20 MG tablet  2 times daily PRN        11/07/21 0135    dicyclomine (BENTYL) 20 MG tablet  Every 8 hours PRN        11/07/21 0135              Amaryllis Dyke, PA-C 11/07/21 0136    Ripley Fraise, MD 11/07/21 0422

## 2021-11-09 ENCOUNTER — Encounter (HOSPITAL_COMMUNITY): Payer: Self-pay

## 2021-11-09 ENCOUNTER — Emergency Department (HOSPITAL_COMMUNITY)
Admission: EM | Admit: 2021-11-09 | Discharge: 2021-11-09 | Disposition: A | Discharge status: Home / Self Care | Payer: Self-pay | Attending: Emergency Medicine | Admitting: Emergency Medicine

## 2021-11-09 ENCOUNTER — Other Ambulatory Visit: Payer: Self-pay

## 2021-11-09 DIAGNOSIS — Z113 Encounter for screening for infections with a predominantly sexual mode of transmission: Secondary | ICD-10-CM | POA: Insufficient documentation

## 2021-11-09 DIAGNOSIS — R82998 Other abnormal findings in urine: Secondary | ICD-10-CM | POA: Insufficient documentation

## 2021-11-09 DIAGNOSIS — R369 Urethral discharge, unspecified: Secondary | ICD-10-CM | POA: Insufficient documentation

## 2021-11-09 DIAGNOSIS — Z202 Contact with and (suspected) exposure to infections with a predominantly sexual mode of transmission: Secondary | ICD-10-CM | POA: Insufficient documentation

## 2021-11-09 DIAGNOSIS — Z711 Person with feared health complaint in whom no diagnosis is made: Secondary | ICD-10-CM

## 2021-11-09 LAB — URINALYSIS, ROUTINE W REFLEX MICROSCOPIC
Bilirubin Urine: NEGATIVE
Glucose, UA: NEGATIVE mg/dL
Ketones, ur: NEGATIVE mg/dL
Nitrite: NEGATIVE
Protein, ur: NEGATIVE mg/dL
Specific Gravity, Urine: 1.015 (ref 1.005–1.030)
WBC, UA: >50 WBC/hpf — ABNORMAL HIGH (ref 0–5)
pH: 5 (ref 5.0–8.0)

## 2021-11-09 LAB — HIV ANTIBODY (ROUTINE TESTING W REFLEX): HIV Screen 4th Generation wRfx: NONREACTIVE

## 2021-11-09 LAB — RPR: RPR Ser Ql: NONREACTIVE

## 2021-11-09 MED ORDER — CEFTRIAXONE SODIUM 1 G IJ SOLR
500.0000 mg | Freq: Once | INTRAMUSCULAR | Status: AC
  Administered 2021-11-09: 500 mg via INTRAMUSCULAR
  Filled 2021-11-09: qty 10

## 2021-11-09 MED ORDER — LIDOCAINE HCL (PF) 1 % IJ SOLN
INTRAMUSCULAR | Status: AC
  Filled 2021-11-09: qty 30

## 2021-11-09 MED ORDER — DOXYCYCLINE HYCLATE 100 MG PO CAPS: 100.0000 mg | ORAL_CAPSULE | Freq: Two times a day (BID) | ORAL | 0 refills | Status: AC

## 2021-11-09 MED ORDER — FOSFOMYCIN TROMETHAMINE 3 G PO PACK
3.0000 g | PACK | Freq: Once | ORAL | Status: AC
  Administered 2021-11-09: 3 g via ORAL
  Filled 2021-11-09: qty 3

## 2021-11-09 NOTE — ED Provider Notes (Signed)
[facility] DEPT Provider Note   CSN: [id number] Arrival date & time: 11/09/21  0841     History  Chief Complaint  Patient presents with   Donald Carr is a 36 y.o. male.  HPI  36 year old male presenting to the emergency department with concern for sexually transmitted infection.  The patient states that he has had burning while urinating and a thick white/yellow discharge coming from the tip of his penis.  He does have a history of chlamydial infection which was treated.  He denies any abdominal pain, fevers or chills.  He denies any other complaints.  He request STI testing.  Home Medications Prior to Admission medications   Medication Sig Start Date End Date Taking? Authorizing Provider  doxycycline (VIBRAMYCIN) 100 MG capsule Take 1 capsule (100 mg total) by mouth 2 (two) times daily for 7 days. 11/09/21 11/16/21 Yes Regan Lemming, MD  dicyclomine (BENTYL) 20 MG tablet Take 1 tablet (20 mg total) by mouth every 8 (eight) hours as needed for spasms (abdominal pain). 11/07/21   Petrucelli, Samantha R, PA-C  famotidine (PEPCID) 20 MG tablet Take 1 tablet (20 mg total) by mouth 2 (two) times daily as needed for heartburn or indigestion (abdominal pain). 11/07/21   Petrucelli, Samantha R, PA-C  ondansetron (ZOFRAN-ODT) 4 MG disintegrating tablet Take 1 tablet (4 mg total) by mouth every 8 (eight) hours as needed for nausea or vomiting. 11/07/21   Petrucelli, Glynda Jaeger, PA-C      Allergies    Patient has no known allergies.    Review of Systems   Review of Systems  All other systems reviewed and are negative.   Physical Exam Updated Vital Signs BP (!) 147/95 (BP Location: Left Arm)   Pulse 78   Temp 98.8 F (37.1 C) (Oral)   Resp 18   Ht '5\' 6"'$  (1.676 m)   Wt 63.5 kg   SpO2 98%   BMI 22.60 kg/m  Physical Exam Vitals and nursing note reviewed. Exam conducted with a chaperone present.  Constitutional:       General: He is not in acute distress. HENT:     Head: Normocephalic and atraumatic.  Eyes:     Conjunctiva/sclera: Conjunctivae normal.     Pupils: Pupils are equal, round, and reactive to light.  Cardiovascular:     Rate and Rhythm: Normal rate and regular rhythm.  Pulmonary:     Effort: Pulmonary effort is normal. No respiratory distress.  Abdominal:     General: There is no distension.     Tenderness: There is no guarding.  Genitourinary:    Comments: Thick yellow penile discharge present Musculoskeletal:        General: No deformity or signs of injury.     Cervical back: Neck supple.  Skin:    Findings: No lesion or rash.  Neurological:     General: No focal deficit present.     Mental Status: He is alert. Mental status is at baseline.     ED Results / Procedures / Treatments   Labs (all labs ordered are listed, but only abnormal results are displayed) Labs Reviewed  URINALYSIS, ROUTINE W REFLEX MICROSCOPIC - Abnormal; Notable for the following components:      Result Value   APPearance HAZY (*)    Hgb urine dipstick SMALL (*)    Leukocytes,Ua LARGE (*)    WBC, UA >50 (*)    Bacteria, UA RARE (*)  All other components within normal limits  URINE CULTURE  HIV ANTIBODY (ROUTINE TESTING W REFLEX)  RPR  GC/CHLAMYDIA PROBE AMP ([place]) NOT AT [facility]  URINE CYTOLOGY ANCILLARY ONLY    EKG None  Radiology No results found.  Procedures Procedures    Medications Ordered in ED Medications  cefTRIAXone (ROCEPHIN) injection 500 mg (500 mg Intramuscular Given 11/09/21 1041)  fosfomycin (MONUROL) packet 3 g (3 g Oral Given 11/09/21 1042)  lidocaine (PF) (XYLOCAINE) 1 % injection (  Given 11/09/21 1042)    ED Course/ Medical Decision Making/ A&P                           Medical Decision Making Amount and/or Complexity of Data Reviewed Labs: ordered.  Risk Prescription drug management.    36 year old male presenting to the emergency department with  concern for sexually transmitted infection.  The patient states that he has had burning while urinating and a thick white/yellow discharge coming from the tip of his penis.  He does have a history of chlamydial infection which was treated.  He denies any abdominal pain, fevers or chills.  He denies any other complaints.  He request STI testing.  GC/chlamydia, trichomonas, HIV and RPR testing was collected and pending.  We will treat empirically treat for gonorrhea and chlamydia with Rocephin IM and doxycycline.  Patient urinalysis significant for large leukocytes, greater than 50 WBCs, rare bacteria.  3 g of fosfomycin administered. Urine culture collected and pending.   Patient informed that results will be available on the patient portal within 2 days.  Overall stable for discharge.    Final Clinical Impression(s) / ED Diagnoses Final diagnoses:  Penile discharge  Concern about STD in male without diagnosis  Encounter for screening examination for sexually transmitted disease    Rx / DC Orders ED Discharge Orders          Ordered    doxycycline (VIBRAMYCIN) 100 MG capsule  2 times daily        11/09/21 1003              Regan Lemming, MD 11/09/21 1051

## 2021-11-09 NOTE — Discharge Instructions (Addendum)
Your GC/Chlamydia, syphyllis (RPR) and HIV results will be available online within 2 days. We will treat empirically for gonorrhea and chlamydia.

## 2021-11-09 NOTE — ED Triage Notes (Signed)
Pt. C/o penial burning and white discharge

## 2021-11-10 LAB — URINE CULTURE: Culture: NO GROWTH

## 2021-11-10 LAB — URINE CYTOLOGY ANCILLARY ONLY
Comment: NEGATIVE
Trichomonas: NEGATIVE

## 2023-02-01 ENCOUNTER — Emergency Department (HOSPITAL_COMMUNITY)
Admission: EM | Admit: 2023-02-01 | Discharge: 2023-02-01 | Disposition: A | Discharge status: Home / Self Care | Payer: Self-pay | Attending: Emergency Medicine | Admitting: Emergency Medicine

## 2023-02-01 ENCOUNTER — Other Ambulatory Visit: Payer: Self-pay

## 2023-02-01 ENCOUNTER — Encounter (HOSPITAL_COMMUNITY): Payer: Self-pay

## 2023-02-01 DIAGNOSIS — L03116 Cellulitis of left lower limb: Secondary | ICD-10-CM | POA: Insufficient documentation

## 2023-02-01 MED ORDER — DOXYCYCLINE HYCLATE 100 MG PO TABS
100.0000 mg | ORAL_TABLET | Freq: Once | ORAL | Status: AC
  Administered 2023-02-01: 100 mg via ORAL
  Filled 2023-02-01: qty 1

## 2023-02-01 MED ORDER — DOXYCYCLINE HYCLATE 100 MG PO CAPS: 100.0000 mg | ORAL_CAPSULE | Freq: Two times a day (BID) | ORAL | 0 refills | Status: DC

## 2023-02-01 NOTE — ED Triage Notes (Signed)
Patient got bit by an insect yesterday on his left thigh. Red around the area.

## 2023-02-01 NOTE — Discharge Instructions (Signed)
It was a pleasure taking care here in the emergency department  Your ultrasound today did not show a drainable fluid collection called an abscess.  I suspect you are at the beginning stages of developing on however.  You have some cellulitis which is infection of the top layer of the skin.  We have started on antibiotics.  I have given you your first dose here.  You may start the second dose this evening.  Make sure to take with food as well as 1 full glass of water.  Please warm compress to area.  This area will likely come to a "head."  And started draining.  This is what is expected.  If you have any significant drainage, increased pain, persistently high fevers please return.  It does take typically about 48 hours for antibiotics to start working.

## 2023-02-01 NOTE — ED Provider Notes (Signed)
[place] EMERGENCY DEPARTMENT AT [facility] Provider Note   CSN: [id number] Arrival date & time: 02/01/23  1052     History  Chief Complaint  Patient presents with   Insect Bite    Donald Carr is a 37 y.o. male here for evaluation of possible insect bite.  Stated yesterday he noted an area on his left lateral mid thigh.  No drainage.  Tender to touch, noted some redness today.  He did not see any insect bite or feel 1.  No history of IV drug use, fever, nausea, vomiting.  No numbness or weakness.  No fusiform swelling to extremity.  HPI     Home Medications Prior to Admission medications   Medication Sig Start Date End Date Taking? Authorizing Provider  doxycycline (VIBRAMYCIN) 100 MG capsule Take 1 capsule (100 mg total) by mouth 2 (two) times daily. 02/01/23  Yes Daphna Lafuente A, PA-C  dicyclomine (BENTYL) 20 MG tablet Take 1 tablet (20 mg total) by mouth every 8 (eight) hours as needed for spasms (abdominal pain). 11/07/21   Petrucelli, Samantha R, PA-C  famotidine (PEPCID) 20 MG tablet Take 1 tablet (20 mg total) by mouth 2 (two) times daily as needed for heartburn or indigestion (abdominal pain). 11/07/21   Petrucelli, Samantha R, PA-C  ondansetron (ZOFRAN-ODT) 4 MG disintegrating tablet Take 1 tablet (4 mg total) by mouth every 8 (eight) hours as needed for nausea or vomiting. 11/07/21   Petrucelli, Pleas Koch, PA-C      Allergies    Patient has no known allergies.    Review of Systems   Review of Systems  Constitutional: Negative.   HENT: Negative.    Respiratory: Negative.    Cardiovascular: Negative.   Gastrointestinal: Negative.   Genitourinary: Negative.   Musculoskeletal: Negative.   Skin:  Positive for color change.  Neurological: Negative.   All other systems reviewed and are negative.  Physical Exam Updated Vital Signs BP 131/83   Pulse 78   Temp 98.8 F (37.1 C) (Oral)   Resp 18   Ht 5\' 7"  (1.702 m)   Wt 59.9 kg   SpO2 100%    BMI 20.67 kg/m  Physical Exam Vitals and nursing note reviewed.  Constitutional:      General: He is not in acute distress.    Appearance: He is well-developed. He is not ill-appearing, toxic-appearing or diaphoretic.  HENT:     Head: Atraumatic.  Eyes:     Pupils: Pupils are equal, round, and reactive to light.  Cardiovascular:     Rate and Rhythm: Normal rate and regular rhythm.     Pulses: Normal pulses.          Dorsalis pedis pulses are 2+ on the right side and 2+ on the left side.     Heart sounds: Normal heart sounds.  Pulmonary:     Effort: Pulmonary effort is normal. No respiratory distress.  Abdominal:     General: There is no distension.     Palpations: Abdomen is soft.  Musculoskeletal:        General: Normal range of motion.     Cervical back: Normal range of motion and neck supple.     Comments: No bony tenderness, compartments soft  Skin:    General: Skin is warm and dry.     Comments: 2 cm area of induration without central fluctuance to left lateral thigh.  No drainage.  No bulla, vesicles, target lesions.  See picture in  chart.  Mild surrounding erythema.  Neurological:     General: No focal deficit present.     Mental Status: He is alert and oriented to person, place, and time.     ED Results / Procedures / Treatments   Labs (all labs ordered are listed, but only abnormal results are displayed) Labs Reviewed - No data to display  EKG None  Radiology No results found.  Procedures Ultrasound ED Soft Tissue  Date/Time: 02/01/2023 1:13 PM  Performed by: Ralph Leyden A, PA-C Authorized by: Linwood Dibbles, PA-C   Procedure details:    Indications: localization of abscess and evaluate for cellulitis     Transverse view:  Visualized   Longitudinal view:  Visualized   Images: archived   Location:    Location: lower extremity     Side:  Left Findings:     no abscess present    cellulitis present     Medications Ordered in  ED Medications  doxycycline (VIBRA-TABS) tablet 100 mg (100 mg Oral Given 02/01/23 1131)   ED Course/ Medical Decision Making/ A&P    37 year old here for evaluation of possible insect bite.  Patient with 2 cm area of induration and erythema to left lateral thigh.  No systemic symptoms.  No history of IV drug use.  He did not see or feel anything bite him.  He has no bony tenderness his compartments are soft.  Bedside ultrasound performed which shows cobblestoning consistent with cellulitis however no drainable collection.  I discussed this with patient.  I do not feel he needs labs or CT scan at this time.  He will be started on doxycycline, first dose given here.  I discussed warm compress and wound recheck in 2 days.  At this time I low suspicion for septic joint, gout, hemarthrosis, necrotizing fasciitis, myositis, compartment syndrome, VTE, ischemia, deep space infection.  Agreeable for wound check in 2 days, close outpatient follow-up and return for new or worsening symptoms  The patient has been appropriately medically screened and/or stabilized in the ED. I have low suspicion for any other emergent medical condition which would require further screening, evaluation or treatment in the ED or require inpatient management.  Patient is hemodynamically stable and in no acute distress.  Patient able to ambulate in department prior to ED.  Evaluation does not show acute pathology that would require ongoing or additional emergent interventions while in the emergency department or further inpatient treatment.  I have discussed the diagnosis with the patient and answered all questions.  Pain is been managed while in the emergency department and patient has no further complaints prior to discharge.  Patient is comfortable with plan discussed in room and is stable for discharge at this time.  I have discussed strict return precautions for returning to the emergency department.  Patient was encouraged to  follow-up with PCP/specialist refer to at discharge.                                 Medical Decision Making Amount and/or Complexity of Data Reviewed External Data Reviewed: radiology and notes.  Risk Prescription drug management.          Final Clinical Impression(s) / ED Diagnoses Final diagnoses:  Cellulitis of left lower extremity    Rx / DC Orders ED Discharge Orders          Ordered    doxycycline (VIBRAMYCIN) 100 MG capsule  2 times daily        02/01/23 1125              Thaison Kolodziejski A, PA-C 02/01/23 1318    Loetta Rough, MD 02/01/23 1332

## 2023-02-01 NOTE — ED Notes (Signed)
AVS with prescriptions provided to and discussed with patient. Pt verbalizes understanding of discharge instructions and denies any questions or concerns at this time. Pt ambulated out of department independently with steady gait. ? ?

## 2023-12-16 ENCOUNTER — Emergency Department (HOSPITAL_COMMUNITY)
Admission: EM | Admit: 2023-12-16 | Discharge: 2023-12-16 | Disposition: A | Discharge status: Home / Self Care | Payer: Self-pay | Attending: Emergency Medicine | Admitting: Emergency Medicine

## 2023-12-16 ENCOUNTER — Other Ambulatory Visit: Payer: Self-pay

## 2023-12-16 DIAGNOSIS — Z202 Contact with and (suspected) exposure to infections with a predominantly sexual mode of transmission: Secondary | ICD-10-CM | POA: Insufficient documentation

## 2023-12-16 LAB — URINALYSIS, W/ REFLEX TO CULTURE (INFECTION SUSPECTED)
Bilirubin Urine: NEGATIVE
Glucose, UA: NEGATIVE mg/dL
Ketones, ur: NEGATIVE mg/dL
Nitrite: NEGATIVE
Protein, ur: NEGATIVE mg/dL
Specific Gravity, Urine: 1.019 (ref 1.005–1.030)
pH: 5 (ref 5.0–8.0)

## 2023-12-16 MED ORDER — STERILE WATER FOR INJECTION IJ SOLN
INTRAMUSCULAR | Status: AC
  Administered 2023-12-16: 0.9 mL
  Filled 2023-12-16: qty 10

## 2023-12-16 MED ORDER — AZITHROMYCIN 250 MG PO TABS
1000.0000 mg | ORAL_TABLET | Freq: Once | ORAL | Status: AC
  Administered 2023-12-16: 1000 mg via ORAL
  Filled 2023-12-16: qty 4

## 2023-12-16 MED ORDER — CEFTRIAXONE SODIUM 250 MG IJ SOLR
250.0000 mg | Freq: Once | INTRAMUSCULAR | Status: AC
  Administered 2023-12-16: 250 mg via INTRAMUSCULAR
  Filled 2023-12-16: qty 250

## 2023-12-16 NOTE — ED Provider Notes (Signed)
 [place] EMERGENCY DEPARTMENT AT [facility] Provider Note   CSN: [id number] Arrival date & time: 12/16/23  9562     Patient presents with: Exposure to STD   Donald Carr is a 38 y.o. male.   The history is provided by the patient and medical records.  Exposure to STD   38 y.o. M here with concern for possible STD.  States recent unprotected oral sex with male partner about a week ago.  States some itchiness with urination.  Denies any frank dysuria or hematuria.  No penile discharge.  No rash or genital lesions.  Prior to Admission medications   Medication Sig Start Date End Date Taking? Authorizing Provider  dicyclomine  (BENTYL ) 20 MG tablet Take 1 tablet (20 mg total) by mouth every 8 (eight) hours as needed for spasms (abdominal pain). 11/07/21   Petrucelli, Samantha R, PA-C  doxycycline  (VIBRAMYCIN ) 100 MG capsule Take 1 capsule (100 mg total) by mouth 2 (two) times daily. 02/01/23   Henderly, Britni A, PA-C  famotidine  (PEPCID ) 20 MG tablet Take 1 tablet (20 mg total) by mouth 2 (two) times daily as needed for heartburn or indigestion (abdominal pain). 11/07/21   Petrucelli, Samantha R, PA-C  ondansetron  (ZOFRAN -ODT) 4 MG disintegrating tablet Take 1 tablet (4 mg total) by mouth every 8 (eight) hours as needed for nausea or vomiting. 11/07/21   Petrucelli, Samantha R, PA-C    Allergies: Patient has no known allergies.    Review of Systems  Genitourinary:  Positive for dysuria.  All other systems reviewed and are negative.   Updated Vital Signs BP (!) 129/91   Pulse 77   Temp 98.4 F (36.9 C) (Oral)   Resp 16   SpO2 100%   Physical Exam Vitals and nursing note reviewed.  Constitutional:      Appearance: He is well-developed.  HENT:     Head: Normocephalic and atraumatic.  Eyes:     Conjunctiva/sclera: Conjunctivae normal.     Pupils: Pupils are equal, round, and reactive to light.  Cardiovascular:     Rate and Rhythm: Normal rate and regular  rhythm.     Heart sounds: Normal heart sounds.  Pulmonary:     Effort: Pulmonary effort is normal.     Breath sounds: Normal breath sounds. No rhonchi.  Musculoskeletal:        General: Normal range of motion.     Cervical back: Normal range of motion.  Skin:    General: Skin is warm and dry.  Neurological:     Mental Status: He is alert and oriented to person, place, and time.     (all labs ordered are listed, but only abnormal results are displayed) Labs Reviewed - No data to display  EKG: None  Radiology: No results found.   Procedures   Medications Ordered in the ED  cefTRIAXone  (ROCEPHIN ) injection 250 mg (250 mg Intramuscular Given 12/16/23 0535)  azithromycin  (ZITHROMAX ) tablet 1,000 mg (1,000 mg Oral Given 12/16/23 0534)  sterile water  (preservative free) injection (0.9 mLs  Given 12/16/23 0539)                                    Medical Decision Making Amount and/or Complexity of Data Reviewed Labs: ordered. ECG/medicine tests: ordered and independent interpretation performed.  Risk Prescription drug management.   38 y.o. M here with concern of STD.  Recent unprotected oral sex a  week ago.  UA with rare bacteria, will send for culture.  Gc/Chl pending.  He opted for empiric treatment with rocephin /azithromycin .  Will be notified if positive.  Can return here for new concerns.  Final diagnoses:  Possible exposure to STD    ED Discharge Orders     None          Jarold Olam CHRISTELLA DEVONNA 12/16/23 0615    Raford Lenis, MD 12/16/23 [phone]

## 2023-12-16 NOTE — Discharge Instructions (Signed)
 Your urine has been sent for culture as well as STD testing.  You will be notified if it is positive, will also update into mychart. Can return here for new concerns.

## 2023-12-16 NOTE — ED Triage Notes (Signed)
 Pt came in POV for STD check. Pt recently had unprotected sexual intercourse about a week ago. Experiencing itchiness but denies dysuria or rash.

## 2023-12-17 LAB — URINE CULTURE: Culture: NO GROWTH

## 2023-12-17 LAB — GC/CHLAMYDIA PROBE AMP (~~LOC~~) NOT AT ARMC
Chlamydia: POSITIVE — AB
Comment: NEGATIVE
Comment: NORMAL
Neisseria Gonorrhea: NEGATIVE

## 2023-12-20 ENCOUNTER — Ambulatory Visit (HOSPITAL_COMMUNITY): Payer: Self-pay

## 2024-01-05 ENCOUNTER — Encounter (HOSPITAL_COMMUNITY): Payer: Self-pay | Admitting: *Deleted

## 2024-01-05 ENCOUNTER — Emergency Department (HOSPITAL_COMMUNITY)
Admission: EM | Admit: 2024-01-05 | Discharge: 2024-01-05 | Disposition: A | Discharge status: Home / Self Care | Payer: Self-pay | Attending: Emergency Medicine | Admitting: Emergency Medicine

## 2024-01-05 ENCOUNTER — Other Ambulatory Visit: Payer: Self-pay

## 2024-01-05 DIAGNOSIS — Z202 Contact with and (suspected) exposure to infections with a predominantly sexual mode of transmission: Secondary | ICD-10-CM | POA: Insufficient documentation

## 2024-01-05 MED ORDER — DOXYCYCLINE HYCLATE 100 MG PO CAPS: 100.0000 mg | ORAL_CAPSULE | Freq: Two times a day (BID) | ORAL | 0 refills | Status: AC

## 2024-01-05 NOTE — ED Provider Notes (Signed)
 [place] EMERGENCY DEPARTMENT AT [facility] Provider Note   CSN: [id number] Arrival date & time: 01/05/24  9441     Patient presents with: [facility] TRANSMITTED DISEASE   Donald Carr is a 38 y.o. male who returns to the ED due to positive STI test.  Patient was seen 2 weeks ago for concern of exposure to STI.  Patient was empirically treated at this time with Rocephin  and 1 g of azithromycin .  He received positive test result, chlamydia, recently and returns for treatment.  States that he has no dysuria, penile discharge.  Does endorse some slight irritation, itching to his penis but no other symptoms.  Reports he has contacted his sexual exposure and notified of the need to be tested.  Denies any other concerns.   HPI     Prior to Admission medications   Medication Sig Start Date End Date Taking? Authorizing Provider  doxycycline  (VIBRAMYCIN ) 100 MG capsule Take 1 capsule (100 mg total) by mouth 2 (two) times daily. 01/05/24  Yes Ruthell Lonni FALCON, PA-C  dicyclomine  (BENTYL ) 20 MG tablet Take 1 tablet (20 mg total) by mouth every 8 (eight) hours as needed for spasms (abdominal pain). 11/07/21   Petrucelli, Samantha R, PA-C  famotidine  (PEPCID ) 20 MG tablet Take 1 tablet (20 mg total) by mouth 2 (two) times daily as needed for heartburn or indigestion (abdominal pain). 11/07/21   Petrucelli, Samantha R, PA-C  ondansetron  (ZOFRAN -ODT) 4 MG disintegrating tablet Take 1 tablet (4 mg total) by mouth every 8 (eight) hours as needed for nausea or vomiting. 11/07/21   Petrucelli, Samantha R, PA-C    Allergies: Patient has no known allergies.    Review of Systems  All other systems reviewed and are negative.   Updated Vital Signs BP (!) 136/97 (BP Location: Left Arm)   Pulse 65   Temp 98.4 F (36.9 C) (Oral)   Resp 16   SpO2 99%   Physical Exam Vitals and nursing note reviewed.  Constitutional:      Appearance: Normal appearance.  HENT:     Head: Normocephalic and  atraumatic.  Cardiovascular:     Rate and Rhythm: Normal rate and regular rhythm.     Pulses: Normal pulses.  Pulmonary:     Effort: Pulmonary effort is normal.     Breath sounds: Normal breath sounds. No wheezing or rales.  Chest:     Chest wall: No tenderness.  Abdominal:     General: Abdomen is flat. There is no distension.     Palpations: Abdomen is soft.     Tenderness: There is no abdominal tenderness. There is no right CVA tenderness, left CVA tenderness or guarding.  Musculoskeletal:     Right lower leg: No edema.     Left lower leg: No edema.  Skin:    General: Skin is warm and dry.     Capillary Refill: Capillary refill takes less than 2 seconds.  Neurological:     General: No focal deficit present.     Mental Status: He is alert and oriented to person, place, and time.     Cranial Nerves: No cranial nerve deficit.     Sensory: No sensory deficit.     Motor: No weakness.  Psychiatric:        Mood and Affect: Mood normal.        Behavior: Behavior normal.     (all labs ordered are listed, but only abnormal results are displayed) Labs Reviewed -  No data to display  EKG: None  Radiology: No results found.   Procedures   Medications Ordered in the ED - No data to display   Medical Decision Making Risk Prescription drug management.   38 year old presents to ED for treatment of positive STI result.  Patient recently was empirically treated with Rocephin  and 1 g of azithromycin  for exposure to STI.  Patient received results recently that show he is positive for chlamydia.  He requesting treatment.  Denies any other concerns.  Patient was prescribed 1 week doxycycline  which he will take twice daily.  He was advised to follow-up with health department.  Advised to contact any further sexual contacts and notify them of positive result.  He voiced understanding.  Given return precautions and voiced understanding.  Stable to discharge.   Final diagnoses:  Exposure  to STD    ED Discharge Orders          Ordered    doxycycline  (VIBRAMYCIN ) 100 MG capsule  2 times daily        01/05/24 0616               Ruthell Lonni FALCON, PA-C 01/05/24 9381    Raford Lenis, MD 01/05/24 [phone]

## 2024-01-05 NOTE — Discharge Instructions (Addendum)
 Please pick up doxycycline  sent to pharmacy.  Take doxycycline  twice a day for 7 days.  Doxycycline  does increase risk of sunburn so wear sunblock if you plan on going in the sun.  Contact any sexual partners to make them aware that any testing.  Practice safe sex in the future.  Return with any new symptoms.

## 2024-01-05 NOTE — ED Triage Notes (Signed)
 Pt was here two weeks ago for STD. Had phone back with positive chlamydia results. Requesting prescription. Having itching, no other symptoms
